# Patient Record
Sex: Male | Born: 1945 | Race: Black or African American | Hispanic: No | Marital: Married | Smoking: Current every day smoker
Health system: Southern US, Community
[De-identification: ages and names within clinical notes are randomized; demographics above are authoritative.]

## PROBLEM LIST (undated history)

## (undated) DIAGNOSIS — H9190 Unspecified hearing loss, unspecified ear: Secondary | ICD-10-CM

## (undated) DIAGNOSIS — D126 Benign neoplasm of colon, unspecified: Secondary | ICD-10-CM

## (undated) DIAGNOSIS — E7800 Pure hypercholesterolemia, unspecified: Secondary | ICD-10-CM

## (undated) DIAGNOSIS — K5730 Diverticulosis of large intestine without perforation or abscess without bleeding: Secondary | ICD-10-CM

## (undated) DIAGNOSIS — F419 Anxiety disorder, unspecified: Secondary | ICD-10-CM

## (undated) DIAGNOSIS — D179 Benign lipomatous neoplasm, unspecified: Secondary | ICD-10-CM

## (undated) DIAGNOSIS — T7840XA Allergy, unspecified, initial encounter: Secondary | ICD-10-CM

## (undated) DIAGNOSIS — Z720 Tobacco use: Secondary | ICD-10-CM

## (undated) DIAGNOSIS — S82409A Unspecified fracture of shaft of unspecified fibula, initial encounter for closed fracture: Secondary | ICD-10-CM

## (undated) DIAGNOSIS — I10 Essential (primary) hypertension: Secondary | ICD-10-CM

## (undated) HISTORY — PX: INGUINAL HERNIA REPAIR: SUR1180

## (undated) HISTORY — PX: POLYPECTOMY: SHX149

## (undated) HISTORY — DX: Unspecified fracture of shaft of unspecified fibula, initial encounter for closed fracture: S82.409A

## (undated) HISTORY — PX: ANKLE SURGERY: SHX546

## (undated) HISTORY — DX: Anxiety disorder, unspecified: F41.9

## (undated) HISTORY — DX: Pure hypercholesterolemia, unspecified: E78.00

## (undated) HISTORY — DX: Benign neoplasm of colon, unspecified: D12.6

## (undated) HISTORY — DX: Allergy, unspecified, initial encounter: T78.40XA

## (undated) HISTORY — DX: Unspecified hearing loss, unspecified ear: H91.90

## (undated) HISTORY — PX: NOSE SURGERY: SHX723

## (undated) HISTORY — DX: Tobacco use: Z72.0

## (undated) HISTORY — DX: Benign lipomatous neoplasm, unspecified: D17.9

## (undated) HISTORY — PX: COLONOSCOPY: SHX174

## (undated) HISTORY — DX: Diverticulosis of large intestine without perforation or abscess without bleeding: K57.30

---

## 2000-07-10 ENCOUNTER — Other Ambulatory Visit: Admission: RE | Admit: 2000-07-10 | Discharge: 2000-07-10 | Payer: Self-pay | Admitting: Gastroenterology

## 2000-07-10 ENCOUNTER — Encounter (INDEPENDENT_AMBULATORY_CARE_PROVIDER_SITE_OTHER): Payer: Self-pay

## 2002-10-22 ENCOUNTER — Encounter: Payer: Self-pay | Admitting: Emergency Medicine

## 2002-10-22 ENCOUNTER — Emergency Department (HOSPITAL_COMMUNITY): Admission: EM | Admit: 2002-10-22 | Discharge: 2002-10-22 | Payer: Self-pay | Admitting: *Deleted

## 2004-10-22 ENCOUNTER — Emergency Department (HOSPITAL_COMMUNITY): Admission: EM | Admit: 2004-10-22 | Discharge: 2004-10-22 | Payer: Self-pay | Admitting: Emergency Medicine

## 2004-10-26 ENCOUNTER — Ambulatory Visit: Payer: Self-pay | Admitting: Pulmonary Disease

## 2004-12-07 ENCOUNTER — Ambulatory Visit: Payer: Self-pay | Admitting: Pulmonary Disease

## 2004-12-20 ENCOUNTER — Ambulatory Visit (HOSPITAL_COMMUNITY): Admission: RE | Admit: 2004-12-20 | Discharge: 2004-12-20 | Payer: Self-pay | Admitting: Orthopedic Surgery

## 2005-04-13 ENCOUNTER — Ambulatory Visit: Payer: Self-pay | Admitting: Pulmonary Disease

## 2005-05-01 ENCOUNTER — Ambulatory Visit: Payer: Self-pay | Admitting: Pulmonary Disease

## 2005-09-14 ENCOUNTER — Ambulatory Visit: Payer: Self-pay | Admitting: Pulmonary Disease

## 2005-09-18 ENCOUNTER — Ambulatory Visit (HOSPITAL_COMMUNITY): Admission: RE | Admit: 2005-09-18 | Discharge: 2005-09-18 | Payer: Self-pay | Admitting: Pulmonary Disease

## 2006-04-02 ENCOUNTER — Ambulatory Visit: Payer: Self-pay | Admitting: Pulmonary Disease

## 2006-04-04 ENCOUNTER — Ambulatory Visit: Payer: Self-pay | Admitting: *Deleted

## 2006-09-16 IMAGING — US US ABDOMEN COMPLETE
1 series · 14 of 25 positions shown · non-contrast
Comparison: none

CLINICAL DATA: Abdominal pain particularly right upper quadrant.
 ABDOMEN ULTRASOUND:
TECHNIQUE: Complete abdominal ultrasound examination was performed including evaluation of the liver, gallbladder, bile ducts, pancreas, kidneys, spleen, IVC, and abdominal aorta.

[Series 1: unknown · 0.33mm/px · 14 of 114 slices shown]
[im 1/114]
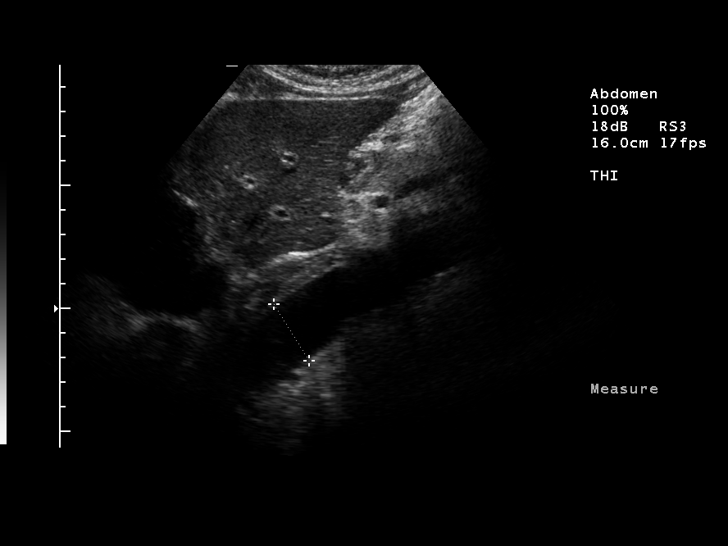
[im 10/114]
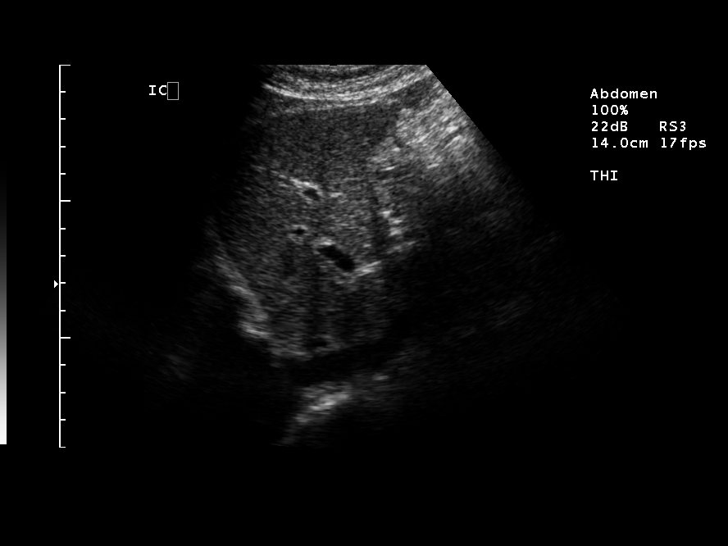
[im 19/114]
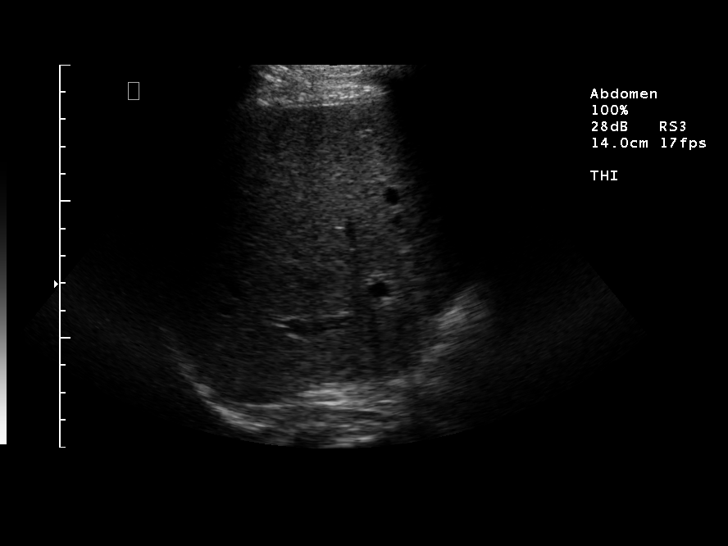
[im 29/114]
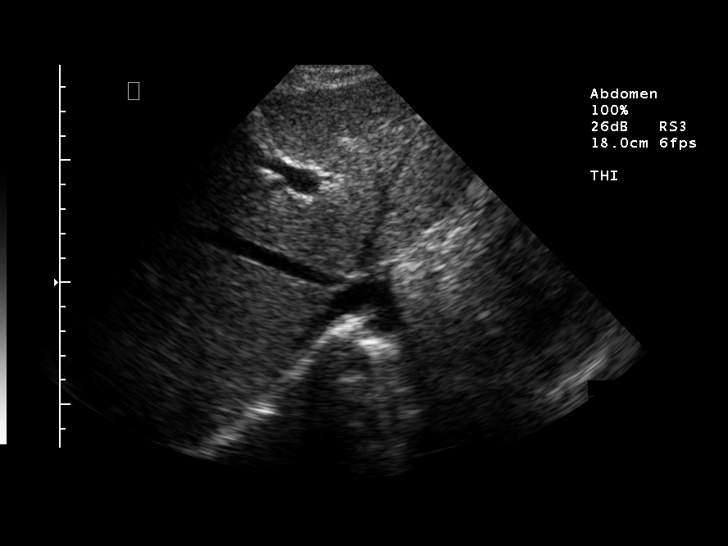
[im 38/114]
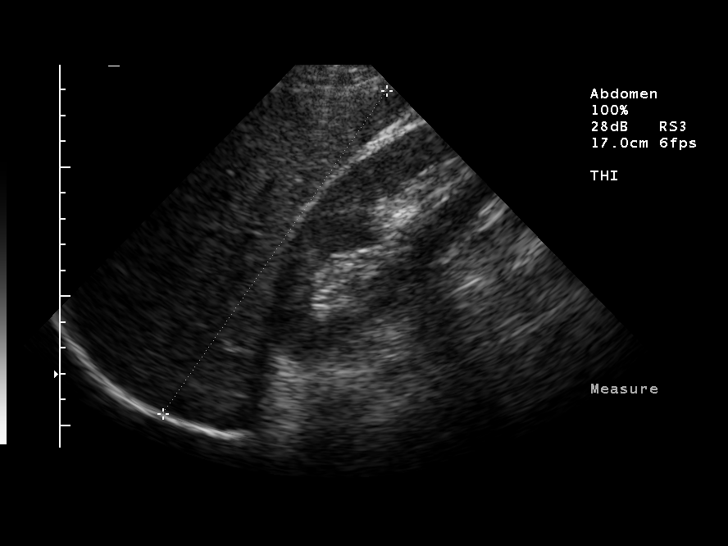
[im 43/114]
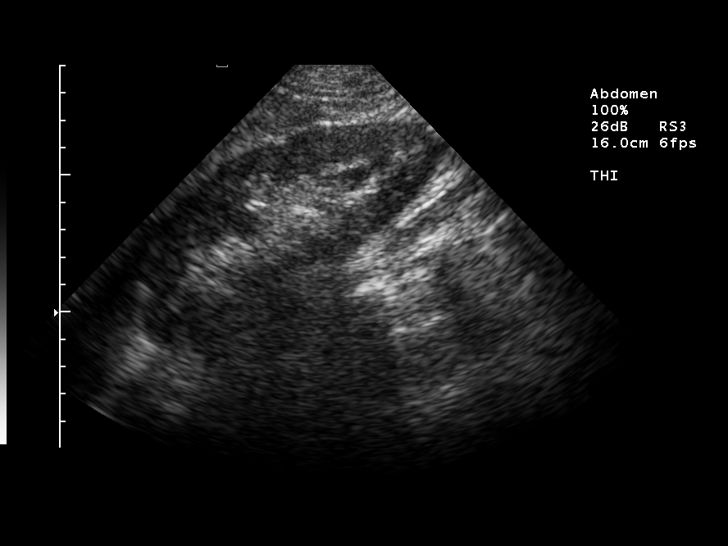
[im 52/114]
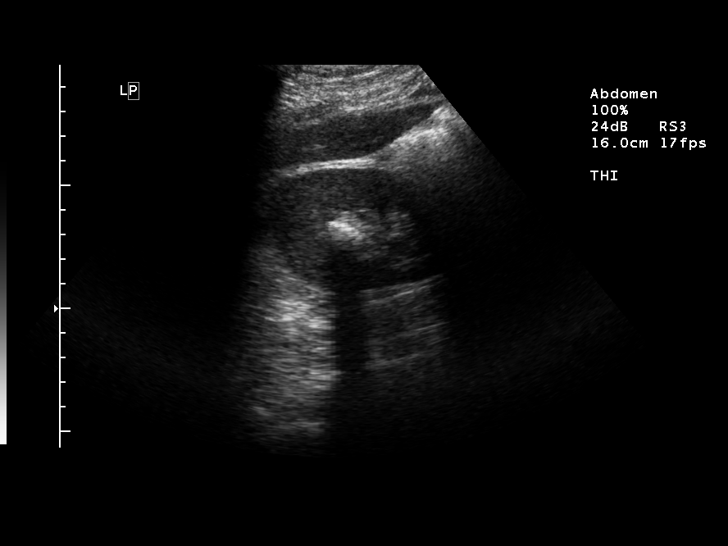
[im 62/114]
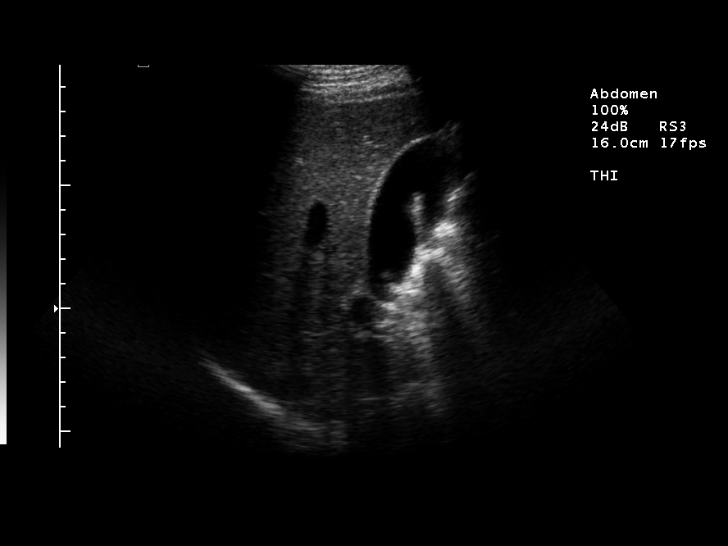
[im 71/114]
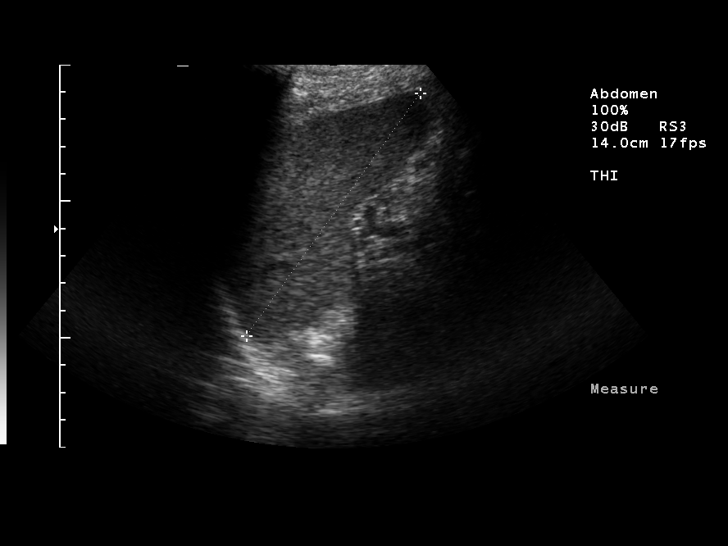
[im 76/114]
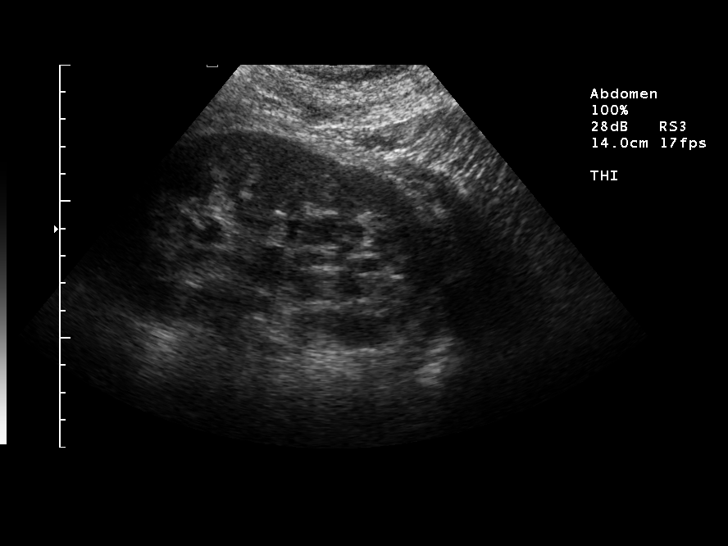
[im 85/114]
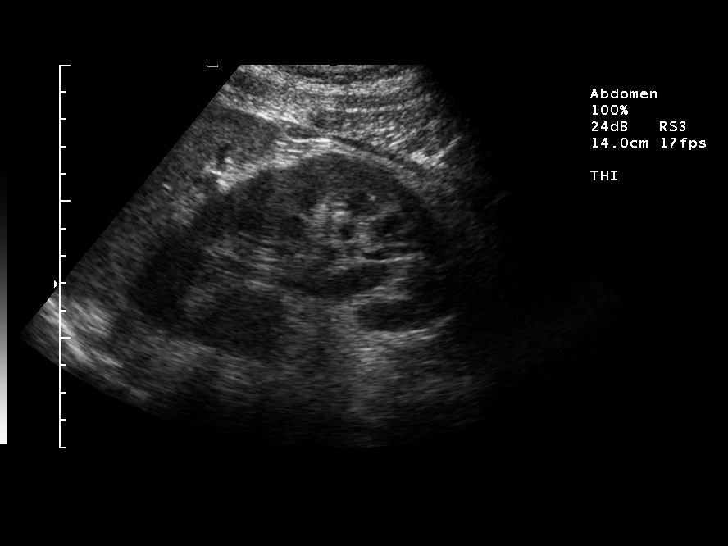
[im 95/114]
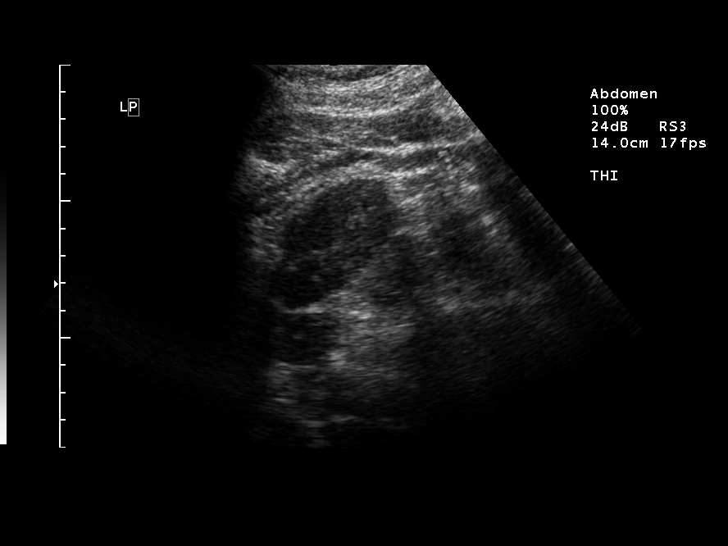
[im 104/114]
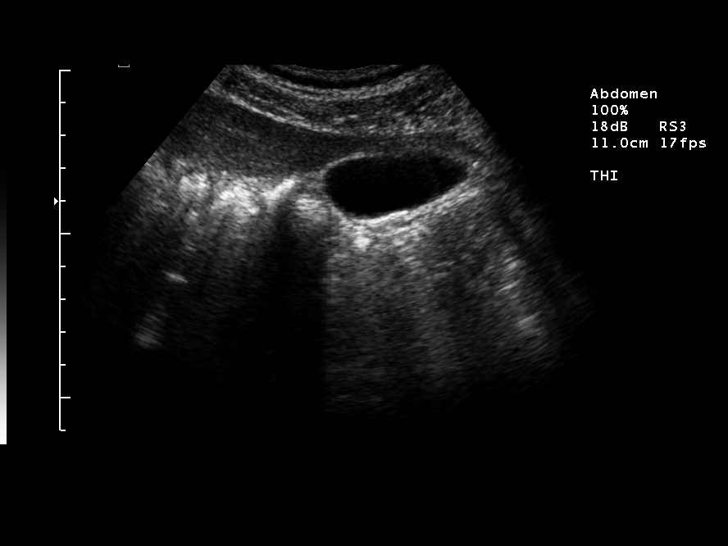
[im 114/114]
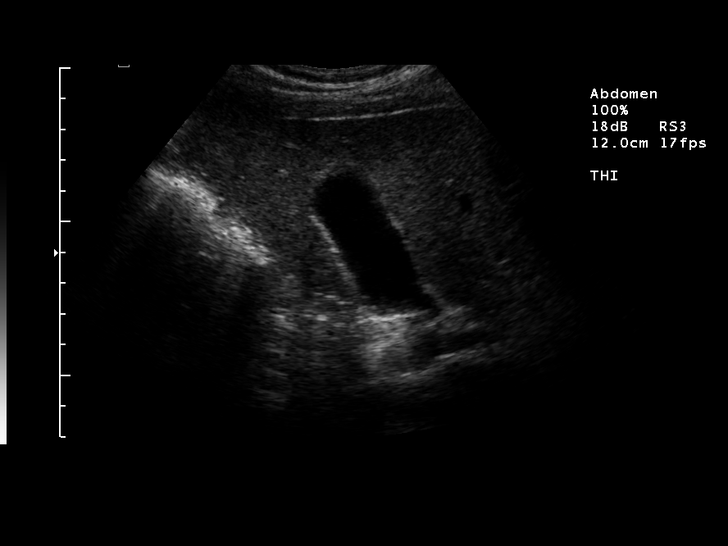

[14 of 25 positions shown; findings below may reference images not displayed]

FINDINGS: Scans over the upper abdomen were performed.  The gallbladder is well seen and no gallstones are noted.  The liver has a normal echogenic pattern.  No ductal dilatation is seen.  The common bile duct is normal measuring 5.3 mm.  The IVC, pancreas, and spleen appear normal.  No hydronephrosis is seen.  The right kidney measures 13.1 cm sagittally with the left kidney measuring 13.7 cm.  There does appear to be a calculus which is nonobstructing in the lower pole on the right measuring 1.3 cm.  Two hypoechoic structures are noted on the left consistent with small cysts of 1.5 and 2.8 cm maximum diameter respectively.  The abdominal aorta is within normal limits in size with no focal aneurysmal dilatation.
IMPRESSION: 1.  No gallstones.
 2.  No ductal dilatation.
 3.  Nonobstructing 1.3 cm lower pole right renal calculus.

## 2007-02-27 ENCOUNTER — Emergency Department (HOSPITAL_COMMUNITY): Admission: EM | Admit: 2007-02-27 | Discharge: 2007-02-27 | Payer: Self-pay | Admitting: Emergency Medicine

## 2007-02-27 ENCOUNTER — Encounter: Payer: Self-pay | Admitting: Orthopedic Surgery

## 2007-03-04 ENCOUNTER — Ambulatory Visit: Payer: Self-pay | Admitting: Orthopedic Surgery

## 2007-03-04 DIAGNOSIS — S82409A Unspecified fracture of shaft of unspecified fibula, initial encounter for closed fracture: Secondary | ICD-10-CM | POA: Insufficient documentation

## 2007-03-11 ENCOUNTER — Telehealth (INDEPENDENT_AMBULATORY_CARE_PROVIDER_SITE_OTHER): Payer: Self-pay | Admitting: *Deleted

## 2007-04-10 ENCOUNTER — Encounter: Payer: Self-pay | Admitting: Orthopedic Surgery

## 2007-05-17 DIAGNOSIS — E785 Hyperlipidemia, unspecified: Secondary | ICD-10-CM | POA: Insufficient documentation

## 2007-05-17 DIAGNOSIS — D126 Benign neoplasm of colon, unspecified: Secondary | ICD-10-CM | POA: Insufficient documentation

## 2007-05-17 DIAGNOSIS — J209 Acute bronchitis, unspecified: Secondary | ICD-10-CM | POA: Insufficient documentation

## 2007-05-17 DIAGNOSIS — E7800 Pure hypercholesterolemia, unspecified: Secondary | ICD-10-CM | POA: Insufficient documentation

## 2007-05-17 DIAGNOSIS — F411 Generalized anxiety disorder: Secondary | ICD-10-CM | POA: Insufficient documentation

## 2007-05-17 DIAGNOSIS — I10 Essential (primary) hypertension: Secondary | ICD-10-CM | POA: Insufficient documentation

## 2007-05-20 ENCOUNTER — Ambulatory Visit: Payer: Self-pay | Admitting: Pulmonary Disease

## 2007-05-20 DIAGNOSIS — K5730 Diverticulosis of large intestine without perforation or abscess without bleeding: Secondary | ICD-10-CM | POA: Insufficient documentation

## 2007-05-20 DIAGNOSIS — N200 Calculus of kidney: Secondary | ICD-10-CM | POA: Insufficient documentation

## 2007-05-20 DIAGNOSIS — D179 Benign lipomatous neoplasm, unspecified: Secondary | ICD-10-CM | POA: Insufficient documentation

## 2007-05-20 HISTORY — DX: Diverticulosis of large intestine without perforation or abscess without bleeding: K57.30

## 2007-06-02 DIAGNOSIS — M5136 Other intervertebral disc degeneration, lumbar region: Secondary | ICD-10-CM | POA: Insufficient documentation

## 2008-05-20 ENCOUNTER — Encounter (INDEPENDENT_AMBULATORY_CARE_PROVIDER_SITE_OTHER): Payer: Self-pay | Admitting: *Deleted

## 2008-06-23 ENCOUNTER — Ambulatory Visit: Payer: Self-pay | Admitting: Gastroenterology

## 2008-06-23 ENCOUNTER — Telehealth (INDEPENDENT_AMBULATORY_CARE_PROVIDER_SITE_OTHER): Payer: Self-pay | Admitting: *Deleted

## 2008-07-07 ENCOUNTER — Encounter: Payer: Self-pay | Admitting: Gastroenterology

## 2008-07-07 ENCOUNTER — Ambulatory Visit: Payer: Self-pay | Admitting: Gastroenterology

## 2008-07-08 ENCOUNTER — Encounter: Payer: Self-pay | Admitting: Gastroenterology

## 2008-07-09 ENCOUNTER — Ambulatory Visit: Payer: Self-pay | Admitting: Pulmonary Disease

## 2008-07-09 DIAGNOSIS — R300 Dysuria: Secondary | ICD-10-CM | POA: Insufficient documentation

## 2008-07-13 LAB — CONVERTED CEMR LAB
Ketones, ur: NEGATIVE mg/dL
Leukocytes, UA: NEGATIVE
Nitrite: NEGATIVE
Specific Gravity, Urine: 1.025 (ref 1.000–1.030)
Urobilinogen, UA: 0.2 (ref 0.0–1.0)

## 2008-11-09 ENCOUNTER — Telehealth (INDEPENDENT_AMBULATORY_CARE_PROVIDER_SITE_OTHER): Payer: Self-pay | Admitting: *Deleted

## 2008-11-12 ENCOUNTER — Ambulatory Visit: Payer: Self-pay | Admitting: Pulmonary Disease

## 2008-11-16 ENCOUNTER — Ambulatory Visit: Payer: Self-pay | Admitting: Adult Health

## 2008-11-17 LAB — CONVERTED CEMR LAB
ALT: 16 units/L (ref 0–53)
Alkaline Phosphatase: 59 units/L (ref 39–117)
BUN: 9 mg/dL (ref 6–23)
Basophils Absolute: 0 10*3/uL (ref 0.0–0.1)
Bilirubin, Direct: 0.2 mg/dL (ref 0.0–0.3)
Cholesterol: 133 mg/dL (ref 0–200)
Eosinophils Absolute: 0.3 10*3/uL (ref 0.0–0.7)
GFR calc non Af Amer: 87.52 mL/min (ref >60)
Lymphocytes Relative: 29.9 % (ref 12.0–46.0)
MCHC: 34.5 g/dL (ref 30.0–36.0)
Neutrophils Relative %: 56.7 % (ref 43.0–77.0)
PSA: 0.59 ng/mL (ref 0.10–4.00)
Potassium: 4.3 meq/L (ref 3.5–5.1)
RBC: 4.28 M/uL (ref 4.22–5.81)
RDW: 11.4 % — ABNORMAL LOW (ref 11.5–14.6)
TSH: 1.63 microintl units/mL (ref 0.35–5.50)
Total Protein: 7 g/dL (ref 6.0–8.3)
Triglycerides: 49 mg/dL (ref 0.0–149.0)
VLDL: 9.8 mg/dL (ref 0.0–40.0)

## 2009-02-24 ENCOUNTER — Ambulatory Visit: Payer: Self-pay | Admitting: Pulmonary Disease

## 2009-02-24 DIAGNOSIS — Z720 Tobacco use: Secondary | ICD-10-CM | POA: Insufficient documentation

## 2009-02-24 DIAGNOSIS — F17200 Nicotine dependence, unspecified, uncomplicated: Secondary | ICD-10-CM

## 2009-07-09 ENCOUNTER — Telehealth (INDEPENDENT_AMBULATORY_CARE_PROVIDER_SITE_OTHER): Payer: Self-pay | Admitting: *Deleted

## 2009-07-09 ENCOUNTER — Encounter: Payer: Self-pay | Admitting: Pulmonary Disease

## 2009-08-02 ENCOUNTER — Telehealth (INDEPENDENT_AMBULATORY_CARE_PROVIDER_SITE_OTHER): Payer: Self-pay | Admitting: *Deleted

## 2009-08-10 ENCOUNTER — Ambulatory Visit: Payer: Self-pay | Admitting: Pulmonary Disease

## 2010-02-21 ENCOUNTER — Ambulatory Visit: Payer: Self-pay | Admitting: Pulmonary Disease

## 2010-02-28 ENCOUNTER — Ambulatory Visit: Payer: Self-pay | Admitting: Pulmonary Disease

## 2010-02-28 LAB — CONVERTED CEMR LAB
ALT: 23 units/L (ref 0–53)
AST: 20 units/L (ref 0–37)
Albumin: 3.9 g/dL (ref 3.5–5.2)
BUN: 16 mg/dL (ref 6–23)
Basophils Absolute: 0 10*3/uL (ref 0.0–0.1)
Chloride: 105 meq/L (ref 96–112)
Cholesterol: 156 mg/dL (ref 0–200)
Eosinophils Relative: 3.7 % (ref 0.0–5.0)
Glucose, Bld: 107 mg/dL — ABNORMAL HIGH (ref 70–99)
MCV: 95.1 fL (ref 78.0–100.0)
Monocytes Absolute: 0.4 10*3/uL (ref 0.1–1.0)
Neutrophils Relative %: 66.9 % (ref 43.0–77.0)
PSA: 0.5 ng/mL (ref 0.10–4.00)
Platelets: 201 10*3/uL (ref 150.0–400.0)
Potassium: 4.3 meq/L (ref 3.5–5.1)
TSH: 1.38 microintl units/mL (ref 0.35–5.50)
Total Bilirubin: 0.8 mg/dL (ref 0.3–1.2)
WBC: 6 10*3/uL (ref 4.5–10.5)

## 2010-05-01 LAB — CONVERTED CEMR LAB
Albumin: 3.9 g/dL (ref 3.5–5.2)
Alkaline Phosphatase: 66 units/L (ref 39–117)
BUN: 8 mg/dL (ref 6–23)
Basophils Absolute: 0 10*3/uL (ref 0.0–0.1)
Cholesterol: 167 mg/dL (ref 0–200)
Eosinophils Absolute: 0.2 10*3/uL (ref 0.0–0.6)
GFR calc Af Amer: 98 mL/min
GFR calc non Af Amer: 81 mL/min
HCT: 42.2 % (ref 39.0–52.0)
HDL: 48.6 mg/dL (ref >39.0)
Hemoglobin: 13.9 g/dL (ref 13.0–17.0)
MCHC: 33 g/dL (ref 30.0–36.0)
MCV: 91.5 fL (ref 78.0–100.0)
Monocytes Absolute: 0.5 10*3/uL (ref 0.2–0.7)
Monocytes Relative: 8.5 % (ref 3.0–11.0)
Neutro Abs: 2.9 10*3/uL (ref 1.4–7.7)
Neutrophils Relative %: 53.6 % (ref 43.0–77.0)
PSA: 0.52 ng/mL (ref 0.10–4.00)
Potassium: 4.4 meq/L (ref 3.5–5.1)
Sodium: 143 meq/L (ref 135–145)
TSH: 0.97 microintl units/mL (ref 0.35–5.50)
Total Bilirubin: 0.8 mg/dL (ref 0.3–1.2)

## 2010-05-03 NOTE — Letter (Signed)
Summary: [facility]  [address]   [place], [place] [address]   Phone: [phone]  Fax: [phone]    07/09/2009     To Whom It May Concern,   [name] is a 66 year old patient of mine followed for general medical purposes.   He is requesting work leave from 07/12/2009 through 08/02/2009 due to his nerves and insomnia.   We have him on medication for these problems.  Thank you for your consideration in this matter.         Sincerely,       Lonzo Cloud. Elayne Snare. D.  Appended Document: Generic Letter this letter has been placed in recycle bin due to the pts name--should be [name] and not jerry---has been corrected and the correct letter has been left up front for pt to come by and pick up.

## 2010-05-03 NOTE — Assessment & Plan Note (Signed)
Summary: 6 months/ mbw   Primary Care Provider:  Alroy Dust  CC:  6 month ROV & reivew of mult medical problems....  History of Present Illness: 65 y/o BM here for a follow up visit...  he has multiple medical problems as noted below...   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 it has affected his BP & caused systemic symptoms w/ sweating, panic, etc... all symptoms improved w/ LOA & he is contemplating not going back to this position which I support>> note written for time off for medical reasons.   ~  February 21, 2010:  6 month ROV- he reports doing well w/o new complaints or concerns... BP is still elev however & he hasn't been checking at home so we decided to incr the Norvasc to 10mg /d... he denies CP, palpit, SOB, cough, etc... stil smoking 1/2-1ppd & we discussed smoking cessation but he is not motivated to quit, offered Chantix, mentioned nicotine replacement Rx etc... he is due for yearly CXR & he will ret for fasting blood work... OK 2011 Flu vaccine today.    Current Problems:   ASTHMATIC BRONCHITIS, ACUTE (ICD-466.0) - he had quit smoking in 2004 & restarted in the last yr  ~1/2 ppd now... baseline CXR w/ mild chr interstitial changes, NAD... he denies cough, sputum, hemoptysis, worsening dyspnea,  wheezing, chest pains, snoring, daytime hypersomnolence, etc...   ~  CXR 11/10 few chr changes, no infiltrates etc, NAD.Marland Kitchen.  ~  CXR 11/11 showed clear lungs, NAD...  HYPERTENSION (ICD-401.9) - prev controlled on ASA 81mg /d & NORVASC 5mg /d... BP now =160/86 and denies HA, fatigue, visual changes, CP, palipit, dizziness, syncope, dyspnea, edema, etc... we discussed the need to monitor BP carefully at home, keep log of reading, & call if BP consistantly >150/90...   ~  11/11:  BP still elev & we decided to incr NORVASC  10mg /d...  HYPERCHOLESTEROLEMIA (ICD-272.0) - on SIMVASTATIN 20mg /d...  ~  FLP in 2007 on diet alone showed TChol 203, TG 72, HDL 44, LDL 149... start Simva20.  ~  FLP 2/09 on Simva20 showed TChol 167, TG 81, HDL 49, LDL 102... keep same.  ~  FLP 8/10 on Simva20 showed Tchol 133, TG 49, HDL 56, LDL 67... GREAT!  ~  FLP 11/11 on Simva20 showed TChol 156, TG 55, HDL 64, LDL 82  DIVERTICULOSIS OF COLON (ICD-562.10) & COLONIC POLYPS (ICD-211.3) - colonoscopy in 2002 showed several adenomatous polyps... repeat colon 4/05 was neg x divertics... f/u planned 73yrs.  ~  f/u colonoscopy 4/10 showed divertics & several sm polyps- all hyperplastic... f/u planned 59yrs.  Hx of NEPHROLITHIASIS (ICD-592.0) - Sonar & CTAbd 1/08 showed right calyceal calculi (1.3cm stone)... also c/oED on Viagra Prn.  DEGENERATIVE DISC DISEASE (ICD-722.6) - CTAbd 1/08 also revealed lumbar degen changes- esp L4-5 & L5-S1 w/ migrated disc frag in lat recess- he denies LBP etc...  CLOSED FRACTURE OF UNSPECIFIED PART OF FIBULA (ICD-823.81) - he fell from a roof & fractured his Lft fibular head... he was seen by DrHarrison in [place], no surg- just conservative Rx & improved.  ANXIETY (ICD-300.00) - uses Alprazolam as needed.  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 Father died age 41 from cancer in his neck, no hx heart problems. Mother alive age 79 w/ HBP, poor memory, no hx of heart disease. 7Sibs:  4Bro- no health issues;  3Sis- one w/ HBP  Social History: Reviewed history from 08/10/2009 and no changes required. Patient is married.  current smoker - 1ppd, x65yrs rare alcohol  3 children  retired from [employer].  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    Vital Signs:  Patient profile:   65 year old male Height:      73 inches Weight:      180.13 pounds BMI:     23.85 O2 Sat:      92 % on Room air Temp:     97.4 degrees F oral Pulse rate:   73 / minute BP sitting:   160 / 86  (left arm) Cuff size:   regular  Vitals Entered By: Randell Loop CMA (February 21, 2010 9:45 AM)  O2 Sat at Rest %:  92 O2 Flow:  Room air CC: 6 month ROV & reivew of mult medical problems... Is Patient Diabetic? No Pain Assessment Patient in pain? no      Comments no changes in meds per pt   Physical Exam  Additional Exam:  WD, WN, 64 y/o BM in NAD... GENERAL:  Alert & oriented; pleasant & cooperative. HEENT:  [place]/AT, EOM-wnl, PERRLA, EACs-clear, TMs-wnl, NOSE-clear, THROAT-clear &  wnl. NECK:  Supple w/ full ROM; no JVD; normal carotid impulses w/o bruits; no thyromegaly or nodules palpated; no lymphadenopathy. CHEST:  Clear to P & A; without wheezes/ rales/ or rhonchi. HEART:  Regular Rhythm; without murmurs/ rubs/ or gallops. ABDOMEN:  Soft & nontender; normal bowel sounds; no organomegaly or masses detected. EXT: without deformities or arthritic changes; no varicose veins/ venous insuffic/ or edema. NEURO:  CN's intact; motor testing normal; sensory testing normal; gait normal & balance OK. DERM:  No lesions noted; no rash etc...    MISC. Report  Procedure date:  02/21/2010  Findings:      DATA REVIEWED:  ~  CXR 02/21/10, and Fasting Labs 02/28/10...   Impression & Recommendations:  Problem # 1:  ASTHMATIC BRONCHITIS, ACUTE (ICD-466.0) Still smoking &he knows the importance of smoking cessation... Orders: T-2 View CXR ([id number])  Problem # 2:  HYPERTENSION (ICD-401.9) BP remains elev & he hasn't been checking at home... asked to start checking BP at home... rec to incr the NORVASC to 10mg /d... His updated medication list for this problem includes:    Amlodipine Besylate 10 Mg  Tabs (Amlodipine besylate) .Marland Kitchen... Take 1 tab by mouth once daily...  Problem # 3:  HYPERCHOLESTEROLEMIA (ICD-272.0) Stable on the diet + Simva20... His updated medication list for this problem includes:    Simvastatin 20 Mg Tabs (Simvastatin) .Marland Kitchen... 1 tab by mouth at bedtime as directed for cholesterol...  Problem # 4:  DIVERTICULOSIS OF COLON (ICD-562.10) GI stable & up to date...  Problem # 5:  Hx of NEPHROLITHIASIS (ICD-592.0) GU stable...  Problem # 6:  DEGENERATIVE DISC DISEASE (ICD-722.6) Aware- doing satis & only req OTC meds...  Problem # 7:  OTHER MEDICAL PROBLEMS AS NOTED>>> OK Flu shot today.  Complete Medication List: 1)  Adult Aspirin Ec Low Strength 81 Mg Tbec (Aspirin) .... Take 1 tablet by mouth once a day holding 2)  Amlodipine Besylate 10 Mg Tabs  (Amlodipine besylate) .... Take 1 tab by mouth once daily.Marland KitchenMarland Kitchen 3)  Simvastatin 20 Mg Tabs (Simvastatin) .Marland Kitchen.. 1 tab by mouth at bedtime as directed for cholesterol... 4)  Viagra 100 Mg Tabs (Sildenafil citrate) .... Take one tablet by mouth as needed 5)  Alprazolam 0.5 Mg Tabs (Alprazolam) .... Take one half to one tablet three x a day as needed for nerves 6)  Multivitamins Tabs (Multiple vitamin) .... Take 1 tablet by mouth once a day  Other Orders: Influenza Vaccine NON MCR ([id number])  Patient Instructions: 1)  Today we updated your med list- see below.... 2)  We decided to increase the NORVASC (Amlodipine) to 10mg  daily & watch your BP at home... call for any questions... 3)  Today we did your follow up CXR.Marland KitchenMarland Kitchen 4)  We also gave you the 2011 Flu vaccine... 5)  Please return top our lab one morning soon - FASTING- for your annual blood work... .then please call the "phone tree" in a few days for your lab results.Marland KitchenMarland Kitchen  6)  Please schedule a follow-up appointment in 6 months. Prescriptions: AMLODIPINE BESYLATE 10 MG TABS (AMLODIPINE BESYLATE) take 1 tab by mouth once daily...  #30 x 12   Entered and Authorized by:   Michele Mcalpine MD   Signed by:   Michele Mcalpine MD on 02/21/2010   Method used:   Print then Give to Patient   RxID:   [id number]    Immunizations Administered:  Influenza Vaccine # 1:    Vaccine Type: Fluvax Non-MCR    Site: left deltoid    Mfr: GlaxoSmithKline    Dose: 0.5 ml    Route: IM    Given by: Randell Loop CMA    Exp. Date: 10/01/2010    Lot #: [id number]    VIS given: 10/26/09 version given February 21, 2010.  Flu Vaccine Consent Questions:    Do you have a history of severe allergic reactions to this vaccine? no    Any prior history of allergic reactions to egg and/or gelatin? no    Do you have a sensitivity to the preservative Thimersol? no    Do you have a past history of Guillan-Barre Syndrome? no    Do you currently have an acute febrile illness? no     Have you ever had a severe reaction to latex? no    Vaccine information given and explained to patient? yes

## 2010-05-03 NOTE — Progress Notes (Signed)
Summary: leave from work  Phone Note Call from Patient   Caller: Patient Call For: nadel Summary of Call: pt would like extended leave from work Initial call taken by: Rickard Patience,  Aug 02, 2009 10:43 AM  Follow-up for Phone Call        [facility]. [name] CMA  Aug 02, 2009 10:48 AM  Spoke with pt.  Pt states he went back to work today and now requesting an extention x3 more wks.  WIll forward to SN - pls advise if you are ok with this.  Thanks! Follow-up by: Gweneth Dimitri RN,  Aug 02, 2009 11:46 AM  Additional Follow-up for Phone Call Additional follow up Details #1::        per SN---cant give any more time off for this medical diagnosis----any further time off will require eval by specialty consult.  thanks Randell Loop CMA  Aug 02, 2009 4:36 PM     Additional Follow-up for Phone Call Additional follow up Details #2::    LMOMTCB Vernie Murders  Aug 02, 2009 4:39 PM  Returning call Darletta Moll  Aug 03, 2009 8:22 AM   called spoke with patient.  advised of recs advised by SN.  pt verbalized his understanding, but requested to see Dr. Kriste Basque in the office first.  appt made with SN 08-10-09 @ 1500.  pt okay with this date and time. Boone Master CNA  Aug 03, 2009 10:05 AM

## 2010-05-03 NOTE — Progress Notes (Signed)
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 date beginning mon 07/12/09 through 08/02/09. pt wants to pick this up when ready.  Initial call taken by: Tivis Ringer, CNA,  July 09, 2009 9:44 AM  Follow-up for Phone Call        called, spoke with pt.  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  Will forward to SN - pls advise of your recs! Thanks! Follow-up by: Gweneth Dimitri RN,  July 09, 2009 9:51 AM  Additional Follow-up for Phone Call Additional follow up Details #1::        lmom for pt to make him aware of letter that is ready to be picked up. Randell Loop CMA  July 09, 2009 4:05 PM     Additional Follow-up for Phone Call Additional follow up Details #2::    pt did come by and pick this letter up. Randell Loop CMA  July 12, 2009 3:24 PM

## 2010-05-03 NOTE — Letter (Signed)
Summary: [facility]  [address]   [place], [place] [address]   Phone: [phone]  Fax: [phone]    07/09/2009     To Whom It May Concern,   [name] is a 65 year old patient of mine followed for general medical purposes.   He is requesting work leave from 07/12/2009 through 08/02/2009 due to his nerves and insomnia.   We have him on medication for these problems.  Thank you for your consideration in this matter.        Sincerely,      Lonzo Cloud. Kriste Basque,  M.D.

## 2010-05-03 NOTE — Assessment & Plan Note (Signed)
Summary: ov to discuss 08-02-09 phone msg///JJ   Primary Care Provider:  Alroy Dust  CC:  6 month ROV & review of mult medical problems....  History of Present Illness: 65 y/o BM here for a follow up visit...  he has multiple medical problems as noted below...     ~  Feb09:  his only complaints are of an orthopedic nature- he fell in Nov and fractured his Lft fibular head... he was seen by DrHarrison in [place], no surg- just conservative Rx... improved now... also notes some Right shoulder discomfort... no other complaints or concerns... he would like to get his meds refilled for 2009...   ~  February 24, 2009:  doing well overall- his CC is some night sweats> no f/c, no infection symptoms of cough, phlegm, URI, etc... he gets plenty of exercise- yard, cutting wood for wood stove, etc... we checked CXR- clear, NAD... he will adjust heat, covers, etc...   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 it has affected his BP & caused systemic symptoms w/ sweating, panic, etc... all symptoms improved w/ LOA & he is contemplating not going back to this position which I support>> note written for time off for medical reasons.    Current Problems:   ASTHMATIC BRONCHITIS, ACUTE (ICD-466.0) - he had quit smoking in 2004 & restarted in the last yr  ~1ppd now... baseline CXR w/ mild chr interstitial changes, NAD... he denies cough, sputum, hemoptysis, worsening dyspnea,  wheezing, chest pains, snoring, daytime hypersomnolence, etc...   ~  CXR 11/10 few chr changes, no infiltrates etc, NAD...  HYPERTENSION (ICD-401.9) - prev controlled on ASA 81mg /d & NORVASC 5mg /d... BP now =160/90 and denies HA, fatigue, visual changes, CP, palipit, dizziness, syncope, dyspnea, edema, etc... we discussed the need to monitor BP carefully at home, keep log of reading, & call if BP  consistantly >150/90...   HYPERCHOLESTEROLEMIA (ICD-272.0) - on SIMVASTATIN 20mg /d...  ~  FLP in 2007 on diet alone showed TChol 203, TG 72, HDL 44, LDL 149... start Simva20.  ~  FLP 2/09 on Simva20 showed TChol 167, TG 81, HDL 49, LDL 102... keep same.  ~  FLP 8/10 on Simva20 showed Tchol 133, TG 49, HDL 56, LDL 67... GREAT!  DIVERTICULOSIS OF COLON (ICD-562.10) & COLONIC POLYPS (ICD-211.3) - colonoscopy in 2002 showed several adenomatous polyps... repeat colon 4/05 was neg x divertics... f/u planned 43yrs.  ~  f/u colonoscopy 4/10 showed divertics & several sm polyps- all hyperplastic... f/u planned 68yrs.  Hx of NEPHROLITHIASIS (ICD-592.0) - Sonar & CTAbd 1/08 showed right calyceal calculi (1.3cm stone)  DEGENERATIVE DISC DISEASE (ICD-722.6) - CTAbd 1/08 also revealed lumbar degen changes- esp L4-5 & L5-S1 w/ migrated disc frag in lat recess.  CLOSED FRACTURE OF UNSPECIFIED PART OF FIBULA (ICD-823.81) -see above... he fell from a roof!  ANXIETY (ICD-300.00) - uses Alprazolam as needed.  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 Father died age 37 from cancer in his neck, no hx heart problems. Mother alive age 80 w/ HBP, poor memory, no hx of heart disease. 7Sibs:  4Bro- no health issues;  3Sis- one w/ HBP  Social History: Reviewed history from 11/12/2008 and no changes  required. Patient is married.  current smoker - 1ppd, x75yrs rare alcohol  3 children  retired from [employer].  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   Review of Systems      See HPI  The patient denies anorexia, fever, weight loss, weight gain, vision loss, decreased hearing, hoarseness, chest pain, syncope, dyspnea on exertion, peripheral edema, prolonged cough, headaches, hemoptysis, abdominal pain, melena, hematochezia, severe indigestion/heartburn, hematuria, incontinence, muscle weakness, suspicious skin lesions, transient blindness, difficulty walking, depression, unusual weight change, abnormal bleeding, enlarged lymph nodes, and angioedema.    Vital Signs:  Patient profile:   65 year old male Height:      73 inches Weight:      183 pounds O2 Sat:      100 % on Room air Temp:     98.0 degrees F oral Pulse rate:   92 / minute BP sitting:   160 / 90  (right arm) Cuff size:   regular  Vitals Entered By: Randell Loop CMA (Aug 10, 2009 3:01 PM)  O2 Sat at Rest %:  100 O2 Flow:  Room air CC: 6 month ROV & review of mult medical problems... Is Patient Diabetic? No Pain Assessment Patient in pain? no      Comments no changes in meds today   Physical Exam  Additional Exam:  WD, WN, 65 y/o BM in NAD... GENERAL:  Alert & oriented; pleasant & cooperative. HEENT:  [place]/AT, EOM-wnl, PERRLA, EACs-clear, TMs-wnl, NOSE-clear, THROAT-clear & wnl. NECK:  Supple w/ full ROM; no JVD; normal carotid impulses w/o bruits; no thyromegaly or nodules palpated; no lymphadenopathy. CHEST:  Clear to P & A; without wheezes/ rales/ or rhonchi. HEART:  Regular Rhythm; without murmurs/ rubs/ or gallops. ABDOMEN:  Soft & nontender; normal bowel sounds; no organomegaly or masses detected. EXT: without deformities or arthritic changes; no varicose veins/ venous insuffic/ or edema. NEURO:  CN's intact; motor testing normal; sensory testing normal; gait normal & balance OK. DERM:  No  lesions noted; no rash etc...    Impression & Recommendations:  Problem # 1:  HYPERTENSION (ICD-401.9) We discussed the need for home monitoring and increase meds if BP consistantly >150/90...  Goal BP < 140/80's & he understands... His updated medication list for this problem includes:    Norvasc 5 Mg Tabs (Amlodipine besylate) .Marland Kitchen... Take 1 tablet by mouth once a day  Problem # 2:  CIGARETTE SMOKER (ICD-305.1) Discussed smoking cessation strategies and it should be easier w/ decr stress...  Problem # 3:  HYPERCHOLESTEROLEMIA (ICD-272.0) Stable on med>  continue same... His updated medication list for this problem includes:    Simvastatin 20 Mg Tabs (Simvastatin) .Marland Kitchen... 1 tab by mouth at bedtime as directed for cholesterol...  Problem # 4:  ANXIETY (ICD-300.00) As discussed>  note for work, use the [employer]... His updated medication list for this problem includes:    Alprazolam 0.5 Mg Tabs (Alprazolam) .Marland Kitchen... Take one half to one tablet three x a day as needed for nerves  Complete Medication List: 1)  Adult Aspirin Ec Low Strength 81 Mg Tbec (Aspirin) .... Take 1 tablet by mouth once a day holding 2)  Norvasc 5  Mg Tabs (Amlodipine besylate) .... Take 1 tablet by mouth once a day 3)  Simvastatin 20 Mg Tabs (Simvastatin) .Marland Kitchen.. 1 tab by mouth at bedtime as directed for cholesterol... 4)  Viagra 100 Mg Tabs (Sildenafil citrate) .... Take one tablet by mouth as needed 5)  Alprazolam 0.5 Mg Tabs (Alprazolam) .... Take one half to one tablet three x a day as needed for nerves 6)  Multivitamins Tabs (Multiple vitamin) .... Take 1 tablet by mouth once a day  Patient Instructions: 1)  Today we updated your med list- see below.... 2)  For your BP:  it is imperative that you take the Norvasc (Amlodipine) daily... you also need to elim salt (sodium) from your diet.Marland KitchenMarland Kitchen 3)  Please monitor your BP at home> check it 2-3 times per week or more & write it down in a log... if your BP is consistantly  >150/90 then you need to check back in w/ me so we can increase your BP meds for better control... if your BP is regularly <150/90 (ie- 140/ 80's or better) then you are OK... 4)  Call for any questions.Marland KitchenMarland Kitchen 5)  We wrote a letter for time off work from 5/2 - 08/20/09 as we discussed... 6)  Keep your regular follow up appt w/ CXR & fasting blood work for this fall.Marland KitchenMarland Kitchen

## 2010-05-03 NOTE — Letter (Signed)
Summary: [facility]  [address]   [place], [place] [address]   Phone: [phone]  Fax: [phone]    08/10/2009      To Whom It May Concern ,     [name] is a 65 year old patient of mine followed for general medical purposes.   He is requesting additional leave from work from 08-02-2009 until 08-20-2009.   We have him on medication for these problems.  Thank you for your consideration in this matter.        Sincerely,      Lonzo Cloud. Elayne Snare. D.

## 2010-06-20 ENCOUNTER — Telehealth: Payer: Self-pay | Admitting: Pulmonary Disease

## 2010-06-30 NOTE — Progress Notes (Signed)
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 pt will pick up when called. needs back asap.  Initial call taken by: Tivis Ringer, CNA,  June 20, 2010 2:10 PM  Follow-up for Phone Call        called and spoke with pt and he is aware papers are ready to be picked up Robert Nixon CMA  June 21, 2010 8:44 AM

## 2010-08-19 NOTE — Consult Note (Signed)
NAMEKEIFER, HABIB                       ACCOUNT NO.:  [id number]   MEDICAL RECORD NO.:  [id number]                   PATIENT TYPE:  EMS   LOCATION:  [facility]                                 FACILITY:  [facility]   PHYSICIAN:  Karol T. Lazarus Salines, M.D.              DATE OF BIRTH:  1946/01/30   DATE OF CONSULTATION:  10/22/2002  DATE OF DISCHARGE:                                   CONSULTATION   CHIEF COMPLAINT:  Chin swelling.   HISTORY OF PRESENT ILLNESS:  This 65 year old black male has had a small  pimple-type lesion under his chin maybe the better part of a year.  He will  occasionally pick or squeeze at it, producing some whitish, stinky material.  Two or three weeks ago, the area began to become swollen and somewhat  tender.  He saw Dr. Kriste Basque, his primary internist, who placed him on Keflex  twice a day.  He admits that he has only taken maybe one a day and not even  every day off and on for three weeks.  For the past two or three days, the  swelling has gotten progressively worse.  He is feeling like it is starting  to press a little bit against his throat and he came to the emergency room  for attention to this process.  He denies fevers.  He is not really having  much in the way of pain.  No actual breathing or swallowing difficulty or  change in voice.  No trouble moving his jaw, moving his tongue, or  articulating his words.  He has never had a prior similar problem.  He is  not diabetic or otherwise immune compromised.  In the emergency room, the  CBC showed a white count of 8800.  The CT scan showed an incidental left  posterior mandibular periapical abscess and a diffuse cellulitis with some  reactive adenopathy in the submental area.   PHYSICAL EXAMINATION:  GENERAL APPEARANCE:  This is a trim, healthy-  appearing, middle-aged, black male in no acute distress.  Voice is loud and  clear.  Articulation is also clear.  No significant fetor oris.  He is not  warm to touch.   Mental status is intact.  HEENT:  The ears are clear with aerated drums and normal configuration.  Anterior nose clear.  The oral cavity is moist.  The tongue is not  restricted, elevated, or swollen.  The floor of the mouth is soft.  The  tongue is soft.  The base of the tongue is soft.  The palate is normal.  I  did not examine the nasopharynx or hypopharynx.  NECK:  Remarkable for a 5 cm, roughly hemispherical swelling of the  submental area with a core in the center consistent with a sebaceous cyst.  There is no discharge, either purulent or sebaceous material.  There is no  fluctuance or subcutaneous crepitus.  There is no  neck adenopathy.   IMPRESSION:  1. Infected submental sebaceous cyst incompletely treated.  2. Incidental left mandibular molar periapical abscess.  No internal oral     involvement and no threat to airway or swallowing.   PLAN:  I think Keflex is a fine choice, but he will need to use it four  times daily regularly.  I gave him a prescription for about 500 mg, #40  pills.  He declines analgesics.  He is okay to continue working.  He will  give me a progress report in five days to let me know if he is making  progress.  If he is deteriorating, we will see him more quickly.  If  he is doing well, we will see him back in three to four weeks.  Once the  infection has settled down, it may be appropriate to do an excision of the  sebaceous cyst, but not acutely unless he has more of an abscess develop.  I  discussed this with the patient and his wife, who understand and agree.                                               Gloris Manchester. Lazarus Salines, M.D.    KTW/MEDQ  D:  10/22/2002  T:  10/23/2002  Job:  [id number]   cc:   Lonzo Cloud. Kriste Basque, M.D. [facility]

## 2010-08-25 ENCOUNTER — Emergency Department (HOSPITAL_COMMUNITY): Payer: 59

## 2010-08-25 ENCOUNTER — Emergency Department (HOSPITAL_COMMUNITY)
Admission: EM | Admit: 2010-08-25 | Discharge: 2010-08-25 | Disposition: A | Discharge status: Home / Self Care | Payer: 59 | Attending: Emergency Medicine | Admitting: Emergency Medicine

## 2010-08-25 ENCOUNTER — Encounter (HOSPITAL_COMMUNITY): Payer: Self-pay | Admitting: Radiology

## 2010-08-25 ENCOUNTER — Emergency Department (HOSPITAL_COMMUNITY): Payer: No Typology Code available for payment source

## 2010-08-25 DIAGNOSIS — I10 Essential (primary) hypertension: Secondary | ICD-10-CM | POA: Insufficient documentation

## 2010-08-25 DIAGNOSIS — R51 Headache: Secondary | ICD-10-CM | POA: Insufficient documentation

## 2010-08-25 DIAGNOSIS — S022XXA Fracture of nasal bones, initial encounter for closed fracture: Secondary | ICD-10-CM | POA: Insufficient documentation

## 2010-08-25 DIAGNOSIS — Z23 Encounter for immunization: Secondary | ICD-10-CM | POA: Insufficient documentation

## 2010-08-25 HISTORY — DX: Essential (primary) hypertension: I10

## 2010-09-17 ENCOUNTER — Other Ambulatory Visit: Payer: Self-pay | Admitting: Pulmonary Disease

## 2010-09-28 ENCOUNTER — Other Ambulatory Visit: Payer: Self-pay | Admitting: *Deleted

## 2010-09-28 MED ORDER — ALPRAZOLAM 0.5 MG PO TABS
0.5000 mg | ORAL_TABLET | Freq: Three times a day (TID) | ORAL | Status: DC | PRN
Start: 2010-09-28 — End: 2011-04-12

## 2010-09-28 NOTE — Telephone Encounter (Signed)
Fax received from [facility] in [place] for Alprazolam 0.5 mg. Last filled on 08/16/2010. No pending appts and last OV was on 02/21/2010 w/ SN. Refills sent x1 only with note to call for an appt.

## 2010-10-14 ENCOUNTER — Encounter: Payer: Self-pay | Admitting: Internal Medicine

## 2010-10-14 ENCOUNTER — Telehealth: Payer: Self-pay | Admitting: Pulmonary Disease

## 2010-10-14 ENCOUNTER — Ambulatory Visit (INDEPENDENT_AMBULATORY_CARE_PROVIDER_SITE_OTHER): Payer: 59 | Admitting: Internal Medicine

## 2010-10-14 VITALS — BP 126/78 | HR 86 | Temp 98.2°F | Ht 73.0 in | Wt 178.0 lb

## 2010-10-14 DIAGNOSIS — I10 Essential (primary) hypertension: Secondary | ICD-10-CM

## 2010-10-14 DIAGNOSIS — N419 Inflammatory disease of prostate, unspecified: Secondary | ICD-10-CM

## 2010-10-14 MED ORDER — CIPROFLOXACIN HCL 500 MG PO TABS: ORAL_TABLET | ORAL | Status: DC

## 2010-10-14 NOTE — Patient Instructions (Signed)
Cipro 500 mg one half twice daily x 10 days then see Dr Kriste Basque in two weeks to regroup re longterm therapy for prostate enlargement which at this point is only mild to moderate   Bring all medications at visit, he want to change your blood pressure medication to help you urinate better

## 2010-10-14 NOTE — Telephone Encounter (Signed)
Spoke to pt's wife and she took appt for 3:30 today with dr wert

## 2010-10-14 NOTE — Telephone Encounter (Signed)
Will forward to Leigh to address when SN returns to see if okay to overbook him, or if SN okay with him seeing TP.
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
## 2010-10-15 ENCOUNTER — Encounter: Payer: Self-pay | Admitting: Internal Medicine

## 2010-10-15 DIAGNOSIS — N419 Inflammatory disease of prostate, unspecified: Secondary | ICD-10-CM | POA: Insufficient documentation

## 2010-10-15 NOTE — Assessment & Plan Note (Signed)
Nl psa 02/2010 and no evidence ca by exam so most likely this is bph/ prostatitis   Discussed in detail all the  indications, usual  risks and alternatives  relative to the benefits with patient who agrees to proceed with short course of cipro then regroup with Dr Kriste Basque to decide ? Urology referral.

## 2010-10-15 NOTE — Assessment & Plan Note (Signed)
May need to consider change to cardura to help with urinary flow if not better with course of cipro

## 2010-10-17 NOTE — Telephone Encounter (Signed)
appt set. Pt aware.Robert Nixon, CMA

## 2010-10-17 NOTE — Telephone Encounter (Signed)
OK TO ADD PT ON TO SN SCHEDULE FOR 7-27 AT 10:30.  THANKS

## 2010-10-28 ENCOUNTER — Ambulatory Visit (INDEPENDENT_AMBULATORY_CARE_PROVIDER_SITE_OTHER): Payer: No Typology Code available for payment source | Admitting: Pulmonary Disease

## 2010-10-28 ENCOUNTER — Encounter: Payer: Self-pay | Admitting: Pulmonary Disease

## 2010-10-28 DIAGNOSIS — IMO0002 Reserved for concepts with insufficient information to code with codable children: Secondary | ICD-10-CM

## 2010-10-28 DIAGNOSIS — N200 Calculus of kidney: Secondary | ICD-10-CM

## 2010-10-28 DIAGNOSIS — N419 Inflammatory disease of prostate, unspecified: Secondary | ICD-10-CM

## 2010-10-28 DIAGNOSIS — F17200 Nicotine dependence, unspecified, uncomplicated: Secondary | ICD-10-CM

## 2010-10-28 DIAGNOSIS — E7800 Pure hypercholesterolemia, unspecified: Secondary | ICD-10-CM

## 2010-10-28 DIAGNOSIS — D126 Benign neoplasm of colon, unspecified: Secondary | ICD-10-CM

## 2010-10-28 DIAGNOSIS — J209 Acute bronchitis, unspecified: Secondary | ICD-10-CM

## 2010-10-28 DIAGNOSIS — K5730 Diverticulosis of large intestine without perforation or abscess without bleeding: Secondary | ICD-10-CM

## 2010-10-28 DIAGNOSIS — F411 Generalized anxiety disorder: Secondary | ICD-10-CM

## 2010-10-28 DIAGNOSIS — I10 Essential (primary) hypertension: Secondary | ICD-10-CM
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
## 2010-10-30 ENCOUNTER — Encounter: Payer: Self-pay | Admitting: Pulmonary Disease

## 2010-10-30 NOTE — Patient Instructions (Signed)
Today we updated your med list in EPIC>    Continue your current meds the same...  Call for any problems...  Let's plan our usual f/u in the fall w/ CXR & fasting blood work.Marland KitchenMarland Kitchen

## 2010-11-11 ENCOUNTER — Other Ambulatory Visit: Payer: Self-pay | Admitting: Pulmonary Disease

## 2011-02-27 ENCOUNTER — Other Ambulatory Visit: Payer: Self-pay | Admitting: Pulmonary Disease

## 2011-04-12 ENCOUNTER — Other Ambulatory Visit: Payer: Self-pay | Admitting: *Deleted

## 2011-04-12 MED ORDER — ALPRAZOLAM 0.5 MG PO TABS: 0.5000 mg | ORAL_TABLET | Freq: Three times a day (TID) | ORAL | Status: DC | PRN

## 2011-04-26 ENCOUNTER — Ambulatory Visit (INDEPENDENT_AMBULATORY_CARE_PROVIDER_SITE_OTHER)
Admission: RE | Admit: 2011-04-26 | Discharge: 2011-04-26 | Disposition: A | Discharge status: Home / Self Care | Payer: 59 | Source: Ambulatory Visit | Attending: Pulmonary Disease | Admitting: Pulmonary Disease

## 2011-04-26 ENCOUNTER — Encounter: Payer: Self-pay | Admitting: Pulmonary Disease

## 2011-04-26 ENCOUNTER — Ambulatory Visit (INDEPENDENT_AMBULATORY_CARE_PROVIDER_SITE_OTHER): Payer: 59 | Admitting: Pulmonary Disease

## 2011-04-26 DIAGNOSIS — F17200 Nicotine dependence, unspecified, uncomplicated: Secondary | ICD-10-CM

## 2011-04-26 DIAGNOSIS — E7800 Pure hypercholesterolemia, unspecified: Secondary | ICD-10-CM

## 2011-04-26 DIAGNOSIS — IMO0002 Reserved for concepts with insufficient information to code with codable children: Secondary | ICD-10-CM

## 2011-04-26 DIAGNOSIS — F419 Anxiety disorder, unspecified: Secondary | ICD-10-CM

## 2011-04-26 DIAGNOSIS — J209 Acute bronchitis, unspecified: Secondary | ICD-10-CM

## 2011-04-26 DIAGNOSIS — I10 Essential (primary) hypertension: Secondary | ICD-10-CM

## 2011-04-26 DIAGNOSIS — K5730 Diverticulosis of large intestine without perforation or abscess without bleeding: Secondary | ICD-10-CM

## 2011-04-26 DIAGNOSIS — D126 Benign neoplasm of colon, unspecified: Secondary | ICD-10-CM

## 2011-04-26 DIAGNOSIS — N200 Calculus of kidney: Secondary | ICD-10-CM

## 2011-04-26 NOTE — Patient Instructions (Signed)
Today we updated your med list in our EPIC system...    Continue your current medications the same...  Today we did your follow up CXR... Please return to our lab one morning this week for your fasting blood work...    Then please call the [facility] in a few days for your results...    Dial [id number] & when prompted enter your patient number followed by the # symbol...    Your patient number is:  [id number]#  We gave you the 2012 Flu vaccine today...  Please work on smoking cessation & let me know if we can be of assistance in any way...  Call for any questions.Marland KitchenMarland Kitchen
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
## 2011-04-29 ENCOUNTER — Encounter: Payer: Self-pay | Admitting: Pulmonary Disease

## 2011-06-20 ENCOUNTER — Telehealth: Payer: Self-pay | Admitting: Pulmonary Disease

## 2011-06-20 MED ORDER — ALPRAZOLAM 0.5 MG PO TABS: 0.5000 mg | ORAL_TABLET | Freq: Three times a day (TID) | ORAL | Status: DC | PRN

## 2011-06-20 NOTE — Telephone Encounter (Signed)
[facility]  Alprazolam 0.5mg   Take 1 table tid as needed #90 No Known Allergies Dr Kriste Basque is this ok to fill  Thank you

## 2011-07-30 ENCOUNTER — Other Ambulatory Visit: Payer: Self-pay | Admitting: Pulmonary Disease

## 2011-08-21 ENCOUNTER — Telehealth: Payer: Self-pay | Admitting: Pulmonary Disease

## 2011-08-21 NOTE — Telephone Encounter (Signed)
Per SN okay to add pt on for next week.  I spoke with pt wife and since SN had a 12:00 opening on 08/29/11 pt is coming in at that time. Nothing further was needed

## 2011-08-21 NOTE — Telephone Encounter (Signed)
Spoke with the pt and he is asking for an appt with Dr. Kriste Basque sooner then first available due to 2 issues. The first is he is having burning and numbness in his feet x 3 weeks. Pt also wants to discuss some sexual performance issues he is having. He states he does not have an issue with getting an erection, but he is having premature ejaculation. He states this has happened the last several times with intercourse. He states this has never happened in the past. Pt is not comfortable seeing TP for this. Please advise on possible work in [employer]. Thanks. [name], CMA No Known Allergies

## 2011-08-29 ENCOUNTER — Other Ambulatory Visit (INDEPENDENT_AMBULATORY_CARE_PROVIDER_SITE_OTHER): Payer: 59

## 2011-08-29 ENCOUNTER — Ambulatory Visit (INDEPENDENT_AMBULATORY_CARE_PROVIDER_SITE_OTHER): Payer: 59 | Admitting: Pulmonary Disease

## 2011-08-29 ENCOUNTER — Encounter: Payer: Self-pay | Admitting: Pulmonary Disease

## 2011-08-29 VITALS — BP 148/80 | HR 78 | Temp 97.8°F | Ht 73.0 in | Wt 184.8 lb

## 2011-08-29 DIAGNOSIS — F411 Generalized anxiety disorder: Secondary | ICD-10-CM

## 2011-08-29 DIAGNOSIS — D126 Benign neoplasm of colon, unspecified: Secondary | ICD-10-CM

## 2011-08-29 DIAGNOSIS — I10 Essential (primary) hypertension: Secondary | ICD-10-CM

## 2011-08-29 DIAGNOSIS — F419 Anxiety disorder, unspecified: Secondary | ICD-10-CM

## 2011-08-29 DIAGNOSIS — IMO0002 Reserved for concepts with insufficient information to code with codable children: Secondary | ICD-10-CM

## 2011-08-29 DIAGNOSIS — G609 Hereditary and idiopathic neuropathy, unspecified: Secondary | ICD-10-CM

## 2011-08-29 DIAGNOSIS — G629 Polyneuropathy, unspecified: Secondary | ICD-10-CM

## 2011-08-29 DIAGNOSIS — N139 Obstructive and reflux uropathy, unspecified: Secondary | ICD-10-CM

## 2011-08-29 DIAGNOSIS — E7800 Pure hypercholesterolemia, unspecified: Secondary | ICD-10-CM

## 2011-08-29 DIAGNOSIS — N200 Calculus of kidney: Secondary | ICD-10-CM

## 2011-08-29 DIAGNOSIS — K5730 Diverticulosis of large intestine without perforation or abscess without bleeding: Secondary | ICD-10-CM

## 2011-08-29 LAB — CBC WITH DIFFERENTIAL/PLATELET
Basophils Absolute: 0.1 10*3/uL (ref 0.0–0.1)
Basophils Relative: 1 % (ref 0.0–3.0)
Eosinophils Absolute: 0.2 10*3/uL (ref 0.0–0.7)
HCT: 41.1 % (ref 39.0–52.0)
Hemoglobin: 13.7 g/dL (ref 13.0–17.0)
Lymphocytes Relative: 24.7 % (ref 12.0–46.0)
Lymphs Abs: 1.3 10*3/uL (ref 0.7–4.0)
MCHC: 33.4 g/dL (ref 30.0–36.0)
MCV: 93.5 fl (ref 78.0–100.0)
Monocytes Absolute: 0.4 10*3/uL (ref 0.1–1.0)
Neutro Abs: 3.3 10*3/uL (ref 1.4–7.7)
RBC: 4.4 Mil/uL (ref 4.22–5.81)
RDW: 12.8 % (ref 11.5–14.6)

## 2011-08-29 LAB — BASIC METABOLIC PANEL
CO2: 29 mEq/L (ref 19–32)
Calcium: 9.5 mg/dL (ref 8.4–10.5)
Creatinine, Ser: 1.1 mg/dL (ref 0.4–1.5)
GFR: 87.65 mL/min (ref >60.00)
Sodium: 140 mEq/L (ref 135–145)

## 2011-08-29 LAB — URINALYSIS, ROUTINE W REFLEX MICROSCOPIC
Bilirubin Urine: NEGATIVE
Hgb urine dipstick: NEGATIVE
Ketones, ur: NEGATIVE
Nitrite: NEGATIVE
Total Protein, Urine: NEGATIVE
Urine Glucose: NEGATIVE
pH: 6 (ref 5.0–8.0)

## 2011-08-29 LAB — HEPATIC FUNCTION PANEL
Alkaline Phosphatase: 73 U/L (ref 39–117)
Bilirubin, Direct: 0.2 mg/dL (ref 0.0–0.3)
Total Bilirubin: 0.9 mg/dL (ref 0.3–1.2)
Total Protein: 6.9 g/dL (ref 6.0–8.3)

## 2011-08-29 LAB — LIPID PANEL
HDL: 64.5 mg/dL (ref >39.00)
LDL Cholesterol: 88 mg/dL (ref 0–99)
Total CHOL/HDL Ratio: 3

## 2011-08-29 MED ORDER — TRAMADOL HCL 50 MG PO TABS: ORAL_TABLET | ORAL | Status: DC

## 2011-08-29 MED ORDER — GABAPENTIN 100 MG PO CAPS: ORAL_CAPSULE | ORAL | Status: DC
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
## 2011-08-29 NOTE — Patient Instructions (Signed)
Today we updated your med list in our EPIC system...    Continue your current medications the same...  We decided to start NEURONTIN (Gabapentin) 100mg  tabs for the burning/ numbness in your feet...    Start w/ one tab at bedtime and increase to 2 tabs after one week, then go to 3 tabs if needed...  We also wrote for ULTRAM (Tramadol) 50mg  to take 1-2 tabs as we discussed to see if it helps the symptom...  Call for any questions...  Let's plan a routine f/u in 6 months but call sooner if symptoms don't improve/ resolve on Rx.Marland KitchenMarland Kitchen

## 2011-12-01 ENCOUNTER — Other Ambulatory Visit: Payer: Self-pay | Admitting: Pulmonary Disease

## 2012-02-21 ENCOUNTER — Other Ambulatory Visit: Payer: Self-pay | Admitting: Pulmonary Disease

## 2012-02-23 ENCOUNTER — Telehealth: Payer: Self-pay | Admitting: Pulmonary Disease

## 2012-02-23 MED ORDER — ALPRAZOLAM 0.5 MG PO TABS: 0.5000 mg | ORAL_TABLET | Freq: Three times a day (TID) | ORAL | Status: DC | PRN

## 2012-02-23 NOTE — Telephone Encounter (Signed)
Refill called to the pharmacy for the alprazolam.  No further refills can be given without pt having an appt with SN.  Nothing further is needed.

## 2012-03-28 ENCOUNTER — Other Ambulatory Visit: Payer: Self-pay | Admitting: Pulmonary Disease

## 2012-05-30 ENCOUNTER — Other Ambulatory Visit (INDEPENDENT_AMBULATORY_CARE_PROVIDER_SITE_OTHER): Payer: 59

## 2012-05-30 ENCOUNTER — Encounter: Payer: Self-pay | Admitting: Pulmonary Disease

## 2012-05-30 ENCOUNTER — Ambulatory Visit (INDEPENDENT_AMBULATORY_CARE_PROVIDER_SITE_OTHER): Payer: 59 | Admitting: Pulmonary Disease

## 2012-05-30 ENCOUNTER — Ambulatory Visit (INDEPENDENT_AMBULATORY_CARE_PROVIDER_SITE_OTHER)
Admission: RE | Admit: 2012-05-30 | Discharge: 2012-05-30 | Disposition: A | Discharge status: Home / Self Care | Payer: 59 | Source: Ambulatory Visit | Attending: Pulmonary Disease | Admitting: Pulmonary Disease

## 2012-05-30 VITALS — BP 140/80 | HR 90 | Temp 98.3°F | Ht 73.0 in | Wt 184.2 lb

## 2012-05-30 DIAGNOSIS — K5730 Diverticulosis of large intestine without perforation or abscess without bleeding: Secondary | ICD-10-CM

## 2012-05-30 DIAGNOSIS — Z0000 Encounter for general adult medical examination without abnormal findings: Secondary | ICD-10-CM

## 2012-05-30 DIAGNOSIS — F17200 Nicotine dependence, unspecified, uncomplicated: Secondary | ICD-10-CM

## 2012-05-30 DIAGNOSIS — IMO0002 Reserved for concepts with insufficient information to code with codable children: Secondary | ICD-10-CM

## 2012-05-30 DIAGNOSIS — F411 Generalized anxiety disorder: Secondary | ICD-10-CM

## 2012-05-30 DIAGNOSIS — I10 Essential (primary) hypertension: Secondary | ICD-10-CM

## 2012-05-30 DIAGNOSIS — N200 Calculus of kidney: Secondary | ICD-10-CM

## 2012-05-30 DIAGNOSIS — D126 Benign neoplasm of colon, unspecified: Secondary | ICD-10-CM

## 2012-05-30 DIAGNOSIS — E7800 Pure hypercholesterolemia, unspecified: Secondary | ICD-10-CM

## 2012-05-30 LAB — HEPATIC FUNCTION PANEL
Albumin: 4.3 g/dL (ref 3.5–5.2)
Alkaline Phosphatase: 74 U/L (ref 39–117)
Total Protein: 7.6 g/dL (ref 6.0–8.3)

## 2012-05-30 LAB — LIPID PANEL
Total CHOL/HDL Ratio: 2
Triglycerides: 52 mg/dL (ref 0.0–149.0)

## 2012-05-30 LAB — BASIC METABOLIC PANEL
CO2: 30 mEq/L (ref 19–32)
Chloride: 100 mEq/L (ref 96–112)
Glucose, Bld: 98 mg/dL (ref 70–99)
Potassium: 3.8 mEq/L (ref 3.5–5.1)
Sodium: 137 mEq/L (ref 135–145)

## 2012-05-30 LAB — URINALYSIS, ROUTINE W REFLEX MICROSCOPIC
Hgb urine dipstick: NEGATIVE
Nitrite: NEGATIVE
Specific Gravity, Urine: <=1.005 (ref 1.000–1.030)
Urobilinogen, UA: 0.2 (ref 0.0–1.0)

## 2012-05-30 LAB — CBC WITH DIFFERENTIAL/PLATELET
Basophils Absolute: 0 10*3/uL (ref 0.0–0.1)
Eosinophils Relative: 2.2 % (ref 0.0–5.0)
HCT: 45.6 % (ref 39.0–52.0)
Hemoglobin: 15.4 g/dL (ref 13.0–17.0)
Lymphocytes Relative: 20.7 % (ref 12.0–46.0)
Lymphs Abs: 1.5 10*3/uL (ref 0.7–4.0)
Monocytes Relative: 7.1 % (ref 3.0–12.0)
Neutro Abs: 5 10*3/uL (ref 1.4–7.7)
RDW: 13.2 % (ref 11.5–14.6)
WBC: 7.2 10*3/uL (ref 4.5–10.5)

## 2012-05-30 LAB — TSH: TSH: 1.63 u[IU]/mL (ref 0.35–5.50)

## 2012-05-30 LAB — LDL CHOLESTEROL, DIRECT: Direct LDL: 109.4 mg/dL
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
## 2012-05-30 NOTE — Patient Instructions (Addendum)
Today we updated your med list in our EPIC system...    Continue your current medications the same...  Today we did your follow up CXR & FASTING blood work...    We will contact you w/ the results when avail...  We completed your Driver's forms as well...  Call for any questions...  Let's plan a routine f/u in 1 yr but call any time if needed for problems.Marland KitchenMarland Kitchen

## 2012-06-30 ENCOUNTER — Other Ambulatory Visit: Payer: Self-pay | Admitting: Pulmonary Disease

## 2012-09-26 ENCOUNTER — Other Ambulatory Visit: Payer: Self-pay | Admitting: Pulmonary Disease

## 2012-10-02 ENCOUNTER — Other Ambulatory Visit: Payer: Self-pay | Admitting: Pulmonary Disease

## 2012-10-14 ENCOUNTER — Other Ambulatory Visit: Payer: Self-pay | Admitting: Pulmonary Disease

## 2013-02-06 ENCOUNTER — Telehealth: Payer: Self-pay | Admitting: Pulmonary Disease

## 2013-02-06 MED ORDER — ALPRAZOLAM 0.5 MG PO TABS: 0.5000 mg | ORAL_TABLET | Freq: Three times a day (TID) | ORAL | Status: DC | PRN

## 2013-02-06 NOTE — Telephone Encounter (Signed)
I spoke with pt. Aware rx has been sent and needs to keep pending appt for further refill.s

## 2013-03-13 ENCOUNTER — Ambulatory Visit: Payer: 59 | Admitting: Pulmonary Disease

## 2013-03-25 ENCOUNTER — Encounter: Payer: Self-pay | Admitting: Pulmonary Disease

## 2013-03-25 ENCOUNTER — Ambulatory Visit (INDEPENDENT_AMBULATORY_CARE_PROVIDER_SITE_OTHER): Payer: Medicare Other | Admitting: Pulmonary Disease

## 2013-03-25 ENCOUNTER — Other Ambulatory Visit (INDEPENDENT_AMBULATORY_CARE_PROVIDER_SITE_OTHER): Payer: Medicare Other

## 2013-03-25 VITALS — BP 150/72 | HR 82 | Temp 98.1°F | Ht 73.0 in | Wt 195.2 lb

## 2013-03-25 DIAGNOSIS — E7800 Pure hypercholesterolemia, unspecified: Secondary | ICD-10-CM

## 2013-03-25 DIAGNOSIS — D179 Benign lipomatous neoplasm, unspecified: Secondary | ICD-10-CM

## 2013-03-25 DIAGNOSIS — D126 Benign neoplasm of colon, unspecified: Secondary | ICD-10-CM

## 2013-03-25 DIAGNOSIS — I10 Essential (primary) hypertension: Secondary | ICD-10-CM

## 2013-03-25 DIAGNOSIS — F411 Generalized anxiety disorder: Secondary | ICD-10-CM

## 2013-03-25 DIAGNOSIS — K5730 Diverticulosis of large intestine without perforation or abscess without bleeding: Secondary | ICD-10-CM

## 2013-03-25 DIAGNOSIS — F17200 Nicotine dependence, unspecified, uncomplicated: Secondary | ICD-10-CM

## 2013-03-25 DIAGNOSIS — N200 Calculus of kidney: Secondary | ICD-10-CM

## 2013-03-25 DIAGNOSIS — IMO0002 Reserved for concepts with insufficient information to code with codable children: Secondary | ICD-10-CM

## 2013-03-25 LAB — LIPID PANEL: Cholesterol: 178 mg/dL (ref 0–200)

## 2013-03-25 LAB — BASIC METABOLIC PANEL
BUN: 11 mg/dL (ref 6–23)
CO2: 28 mEq/L (ref 19–32)
Chloride: 107 mEq/L (ref 96–112)
Glucose, Bld: 84 mg/dL (ref 70–99)
Potassium: 4.9 mEq/L (ref 3.5–5.1)

## 2013-03-25 NOTE — Patient Instructions (Signed)
Today we updated your med list in our EPIC system...    Continue your current medications the same...  Start monitoring your BP at home>>    Check your BP about 2-3 times per week 7 write down the results...    Bring the results with you to the follow up visits...    Call me if your BP is regularly >150/90 so we can add medication...    In the meanwhile- remember: NO SALT & get the wt down a few lbs...  Today we did your follow up blood work...    We will contact you w/ the results when available...   Call for any questions...  Let's plan a follow up visit in 62mo, sooner if needed for problems.Marland KitchenMarland Kitchen
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
## 2013-03-31 ENCOUNTER — Telehealth: Payer: Self-pay | Admitting: Pulmonary Disease

## 2013-03-31 NOTE — Telephone Encounter (Signed)
Notes Recorded by Marcellus Scott, CMA on 03/28/2013 at 5:06 PM [email or web address] ------  Notes Recorded by Michele Mcalpine, MD on 03/26/2013 at 1:10 PM Please notify patient>  FLP looks great on diet + Simva20> continue same... Chems are wnl... Pt advised. [name], CMA

## 2013-04-03 ENCOUNTER — Other Ambulatory Visit: Payer: Self-pay | Admitting: Pulmonary Disease

## 2013-05-15 ENCOUNTER — Encounter: Payer: Self-pay | Admitting: Gastroenterology

## 2013-06-05 ENCOUNTER — Other Ambulatory Visit: Payer: Self-pay | Admitting: Pulmonary Disease

## 2013-06-05 NOTE — Telephone Encounter (Signed)
Pt last had alprazolam refilled 04/18/13 #90 x 0 refills Last ov 03/25/13 Pending 09/23/13 w/ SN Please advise regarding refill? thanks

## 2013-06-06 NOTE — Telephone Encounter (Signed)
Per SN--  Ok to refill xs 5 thanks

## 2013-06-10 ENCOUNTER — Encounter: Payer: Self-pay | Admitting: Gastroenterology

## 2013-06-17 ENCOUNTER — Telehealth: Payer: Self-pay | Admitting: Pulmonary Disease

## 2013-06-17 DIAGNOSIS — F411 Generalized anxiety disorder: Secondary | ICD-10-CM

## 2013-06-17 DIAGNOSIS — I10 Essential (primary) hypertension: Secondary | ICD-10-CM

## 2013-06-17 DIAGNOSIS — E7800 Pure hypercholesterolemia, unspecified: Secondary | ICD-10-CM

## 2013-06-17 DIAGNOSIS — F17200 Nicotine dependence, unspecified, uncomplicated: Secondary | ICD-10-CM

## 2013-06-17 DIAGNOSIS — N419 Inflammatory disease of prostate, unspecified: Secondary | ICD-10-CM

## 2013-06-17 DIAGNOSIS — G629 Polyneuropathy, unspecified: Secondary | ICD-10-CM

## 2013-06-17 DIAGNOSIS — IMO0002 Reserved for concepts with insufficient information to code with codable children: Secondary | ICD-10-CM

## 2013-06-17 NOTE — Telephone Encounter (Signed)
Called spoke with patient's spouse to discuss SN retiring from [facility] as of April 1 Pt has appt with SN on 6.26.15, this has been cancelled  Pt spouse stated she would like pt to be referred to [facility] as she has family that goes to that practice.  Office phone number given to spouse for them to call at their convenience.  Referral placed for documentation purposes.  Nothing further needed at this time; will sign off.

## 2013-07-22 ENCOUNTER — Ambulatory Visit (AMBULATORY_SURGERY_CENTER): Payer: Self-pay

## 2013-07-22 VITALS — Ht 73.0 in | Wt 189.8 lb

## 2013-07-22 DIAGNOSIS — Z860100 Personal history of colon polyps, unspecified: Secondary | ICD-10-CM

## 2013-07-22 MED ORDER — PREPOPIK 10-3.5-12 MG-GM-GM PO PACK: 1.0000 | PACK | ORAL | Status: DC

## 2013-07-22 NOTE — Progress Notes (Signed)
Per pt, no allergies to soy or egg products.Pt not taking any weight loss meds or using  O2 at home. 

## 2013-08-01 ENCOUNTER — Other Ambulatory Visit: Payer: Self-pay | Admitting: Pulmonary Disease

## 2013-08-05 ENCOUNTER — Encounter: Payer: Medicare Other | Admitting: Gastroenterology

## 2013-08-11 ENCOUNTER — Telehealth: Payer: Self-pay | Admitting: Pulmonary Disease

## 2013-08-11 MED ORDER — SILDENAFIL CITRATE 100 MG PO TABS: 100.0000 mg | ORAL_TABLET | ORAL | Status: DC | PRN

## 2013-08-11 NOTE — Telephone Encounter (Signed)
Rx for Viagra refilled x 1 only  Spoke with the pt and notified that this was done, and be sure and keep appt to see Burchette  Pt verbalized understanding and nothing further needed

## 2013-08-20 ENCOUNTER — Ambulatory Visit: Payer: Medicare Other | Admitting: Family Medicine

## 2013-09-23 ENCOUNTER — Ambulatory Visit: Payer: Medicare Other | Admitting: Pulmonary Disease

## 2013-10-07 ENCOUNTER — Other Ambulatory Visit: Payer: Self-pay | Admitting: Pulmonary Disease

## 2013-12-10 ENCOUNTER — Other Ambulatory Visit: Payer: Self-pay | Admitting: Pulmonary Disease

## 2013-12-18 ENCOUNTER — Other Ambulatory Visit: Payer: Self-pay | Admitting: Pulmonary Disease

## 2013-12-24 ENCOUNTER — Encounter: Payer: Self-pay | Admitting: Gastroenterology

## 2014-02-09 ENCOUNTER — Other Ambulatory Visit: Payer: Self-pay | Admitting: Pulmonary Disease

## 2014-02-23 ENCOUNTER — Ambulatory Visit (AMBULATORY_SURGERY_CENTER): Payer: Self-pay | Admitting: *Deleted

## 2014-02-23 VITALS — Ht 73.0 in | Wt 191.4 lb

## 2014-02-23 DIAGNOSIS — Z86010 Personal history of colonic polyps: Secondary | ICD-10-CM

## 2014-02-23 MED ORDER — MOVIPREP 100 G PO SOLR: 1.0000 | Freq: Once | ORAL | Status: DC

## 2014-02-23 NOTE — Progress Notes (Signed)
No egg or soy allergy. ewm No blood thinners, no diet pills. ewm No home 02 use. ewm No issues with past sedation. ewm Pt declined emmi video. ewm

## 2014-02-24 ENCOUNTER — Ambulatory Visit (INDEPENDENT_AMBULATORY_CARE_PROVIDER_SITE_OTHER): Payer: Medicare Other | Admitting: Family Medicine

## 2014-02-24 ENCOUNTER — Encounter: Payer: Self-pay | Admitting: Family Medicine

## 2014-02-24 VITALS — BP 160/80 | HR 69 | Temp 98.3°F | Wt 190.0 lb

## 2014-02-24 DIAGNOSIS — N5201 Erectile dysfunction due to arterial insufficiency: Secondary | ICD-10-CM

## 2014-02-24 DIAGNOSIS — Z720 Tobacco use: Secondary | ICD-10-CM

## 2014-02-24 DIAGNOSIS — I10 Essential (primary) hypertension: Secondary | ICD-10-CM

## 2014-02-24 DIAGNOSIS — E7800 Pure hypercholesterolemia, unspecified: Secondary | ICD-10-CM

## 2014-02-24 DIAGNOSIS — F17200 Nicotine dependence, unspecified, uncomplicated: Secondary | ICD-10-CM

## 2014-02-24 DIAGNOSIS — N529 Male erectile dysfunction, unspecified: Secondary | ICD-10-CM | POA: Insufficient documentation

## 2014-02-24 DIAGNOSIS — F411 Generalized anxiety disorder: Secondary | ICD-10-CM

## 2014-02-24 MED ORDER — HYDROCHLOROTHIAZIDE 25 MG PO TABS: 25.0000 mg | ORAL_TABLET | Freq: Every day | ORAL | Status: DC

## 2014-02-24 NOTE — Assessment & Plan Note (Signed)
Advised need for cessation. Patient not currently interested.

## 2014-02-24 NOTE — Assessment & Plan Note (Signed)
Poor control over last 3 visits and steadily worsening now up to systolic of 503. We will add hydrochlorothiazide 25 mg to amlodipine 10 mg. Follow-up in 2 weeks

## 2014-02-24 NOTE — Assessment & Plan Note (Signed)
Well-controlled on simvastatin 20 mg. Continue current medications- consider yearly labs.

## 2014-02-24 NOTE — Patient Instructions (Addendum)
Take 1/2 tablet for 3-4 days then increase to full tablet of hydrochlorothiazide.   See me back in 2 weeks for recheck  Great to meet you and have a Happy THanksgiving.   Give yourself an early Christmas gift and quit smoking!   Health Maintenance Due  Topic Date Due  . ZOSTAVAX/shingles- call your insurance 11/13/2007  . PNEUMOCOCCAL POLYSACCHARIDE VACCINE AGE 66 AND OVER - can give next visit 11/12/2012  . COLONOSCOPY - December 07/07/2013

## 2014-02-24 NOTE — Progress Notes (Signed)
Robert Reddish, MD Phone: [phone]  Subjective:  Patient presents today to establish care with me as their new primary care provider. Patient was formerly a patient of Dr. Lenna Nixon for 30 years. Chief complaint-noted.   Hypertension-poor control  BP Readings from Last 3 Encounters:  02/24/14 160/80  03/25/13 150/72  05/30/12 140/80  Home BP monitoring-checking at home 140/80 Compliant with medications-yes without side effects ROS-Denies any CP, HA, SOB, blurry vision, LE edema.   Anxiety ? Generalized Anxiety Disorder- stable Worries about children and wife in a difficult world and also worries about kids in the church.   Onset (min 6 months): 3 years  GAD 7 total: 7 while on xanax twice a day 1. Feeling nervous, anxious or on edge: 1 2. Not being able to stop or control worrying: 1 3. Worrying too much about different things:2 4. Trouble relaxing:0 5. Being so restless that it is hard to sit still:3 6. Becoming easily annoyed or irritable: 0 7. Feeling afraid as if something awful might happen:0  Patient denies history of  regular decongestants, albuterol . IS a smoker. Coffee one a day.  Denies traumatic events, abuse signifying PTSD  ROS- denies depressive symptoms. No SI HI  Hyperlipidemia-well controlled  Lab Results  Component Value Date   LDLCALC 93 03/25/2013  On statin: Simvastatin 20 mg Regular exercise: Very active but no plan cardio ROS- no chest pain or shortness of breath. No myalgias   Tobacco abuse Currently smoking 3/4 pack per day. Not interested in quitting at present. Had family members lived into their 31s or 100s while smoking ROS-no chest pain shortness breath as above  The following were reviewed and entered/updated in epic: Past Medical History  Diagnosis Date  . Hypertension   . Tobacco abuse   . Anxiety   . Benign neoplasm of colon   . Calculus of kidney   . Closed fracture of unspecified part of fibula   . Lipoma of unspecified site    . Pure hypercholesterolemia   . DIVERTICULOSIS OF COLON 05/20/2007    Qualifier: Diagnosis of  By: Robert Gilford MD, Deborra Medina    Patient Active Problem List   Diagnosis Date Noted  . CIGARETTE SMOKER 02/24/2009    Priority: High  . HYPERCHOLESTEROLEMIA 05/17/2007    Priority: Medium  . Anxiety state 05/17/2007    Priority: Medium  . Essential hypertension 05/17/2007    Priority: Medium  . Erectile dysfunction 02/24/2014    Priority: Low  . Peripheral neuropathy 08/29/2011    Priority: Low  . Prostatitis 10/15/2010    Priority: Low  . Degenerative disc disease, lumbar 06/02/2007    Priority: Low  . Lipoma 05/20/2007    Priority: Low  . COLONIC POLYPS 05/17/2007    Priority: Low   Past Surgical History  Procedure Laterality Date  . Inguinal hernia repair    . Ankle surgery      right  . Colonoscopy    . Polypectomy    . Nose surgery      Family History  Problem Relation Age of Onset  . Cancer Father     neck CA  . Hypertension Mother   . Colon cancer Maternal Uncle     Medications- reviewed and updated Current Outpatient Prescriptions  Medication Sig Dispense Refill  . amLODipine (NORVASC) 10 MG tablet TAKE 1 TABLET BY MOUTH EVERY DAY 30 tablet 8  . aspirin 81 MG tablet Take 81 mg by mouth daily.      . Multiple  Vitamins-Minerals (MULTIVITAMIN & MINERAL PO) Take 1 tablet by mouth daily.      . Omega-3 Fatty Acids (OMEGA-3 FISH OIL PO) Take 1 capsule by mouth daily.    . simvastatin (ZOCOR) 20 MG tablet TAKE 1 TABLET AT BEDTIME AS DIRECTED FOR CHOLESTEROL 30 tablet 4  . ALPRAZolam (XANAX) 0.5 MG tablet TAKE 1 TABLET 3 TIMES A DAY AS NEEDED SLEEP/ANXIETY (Patient not taking: Reported on 02/24/2014) 90 tablet 2  . sildenafil (VIAGRA) 100 MG tablet Take 1 tablet (100 mg total) by mouth as needed for erectile dysfunction. (Patient not taking: Reported on 02/24/2014) 10 tablet 0   Allergies-reviewed and updated No Known Allergies  History   Social History  . Marital  Status: Married    Spouse Name: Robert Nixon    Number of Children: N/A  . Years of Education: N/A   Social History Main Topics  . Smoking status: Current Every Day Smoker -- 0.05 packs/day for 12 years    Types: Cigarettes  . Smokeless tobacco: Never Used     Comment: Smoke 10-12 a day  . Alcohol Use: 1.2 - 1.8 oz/week    2-3 Glasses of wine per week     Comment: Drinks brandy at night on occ  . Drug Use: No  . Sexual Activity: Not on file   Other Topics Concern  . Not on file   Social History Narrative   Married. 3 kids. 5 grandkids.       Retired [place]: ride motorcycle    ROS--See HPI   Objective: BP 160/80 mmHg  Pulse 69  Temp(Src) 98.3 F (36.8 C)  Wt 190 lb (86.183 kg) Gen: NAD, resting comfortably in chair, moves easily to table CV: RRR no murmurs rubs or gallops Lungs: CTAB no crackles, wheeze, rhonchi Abdomen: soft/nontender/nondistended/normal bowel sounds.  Ext: no edema Skin: warm, dry, no rash Neuro: grossly normal, moves all extremities   Assessment/Plan:  Essential hypertension Poor control over last 3 visits and steadily worsening now up to systolic of 025. We will add hydrochlorothiazide 25 mg to amlodipine 10 mg. Follow-up in 2 weeks  Anxiety state No history of panic attacks. She GAD7 of 7 only. Patient is on Xanax up to twice a day with this score but I still doubt the diagnosis of generalized anxiety disorder. He primarily uses this medication for sleep. I asked him to only take it for sleep and will follow-up at next visit. In the long run we discussed I would like for him to come off this medication potentially and definitely would not use it for daily monotherapy of anxiety. Would consider SSRI if symptoms not well controlled off daily portion of his xanax. Hopeful to change this problem to insomnia in which case he has been stable and I have less concern of use.   HYPERCHOLESTEROLEMIA Well-controlled on simvastatin 20  mg. Continue current medications- consider yearly labs.  CIGARETTE SMOKER Advised need for cessation. Patient not currently interested.   Meds ordered this encounter  Medications  . hydrochlorothiazide (HYDRODIURIL) 25 MG tablet    Sig: Take 1 tablet (25 mg total) by mouth daily.    Dispense:  30 tablet    Refill:  3

## 2014-02-24 NOTE — Assessment & Plan Note (Signed)
No history of panic attacks. She GAD7 of 7 only. Patient is on Xanax up to twice a day with this score but I still doubt the diagnosis of generalized anxiety disorder. He primarily uses this medication for sleep. I asked him to only take it for sleep and will follow-up at next visit. In the long run we discussed I would like for him to come off this medication potentially and definitely would not use it for daily monotherapy of anxiety. Would consider SSRI if symptoms not well controlled off daily portion of his xanax. Hopeful to change this problem to insomnia in which case he has been stable and I have less concern of use.

## 2014-03-10 ENCOUNTER — Encounter: Payer: Self-pay | Admitting: Gastroenterology

## 2014-03-10 ENCOUNTER — Other Ambulatory Visit: Payer: Self-pay | Admitting: Pulmonary Disease

## 2014-03-10 ENCOUNTER — Ambulatory Visit (AMBULATORY_SURGERY_CENTER): Payer: Medicare Other | Admitting: Gastroenterology

## 2014-03-10 VITALS — BP 132/77 | HR 69 | Temp 97.1°F | Resp 16 | Ht 73.0 in | Wt 191.0 lb

## 2014-03-10 DIAGNOSIS — D123 Benign neoplasm of transverse colon: Secondary | ICD-10-CM

## 2014-03-10 DIAGNOSIS — K635 Polyp of colon: Secondary | ICD-10-CM

## 2014-03-10 DIAGNOSIS — D120 Benign neoplasm of cecum: Secondary | ICD-10-CM

## 2014-03-10 DIAGNOSIS — D125 Benign neoplasm of sigmoid colon: Secondary | ICD-10-CM

## 2014-03-10 DIAGNOSIS — Z86010 Personal history of colonic polyps: Secondary | ICD-10-CM

## 2014-03-10 HISTORY — PX: COLONOSCOPY: SHX174

## 2014-03-10 MED ORDER — SODIUM CHLORIDE 0.9 % IV SOLN: 500.0000 mL | INTRAVENOUS | Status: DC

## 2014-03-10 NOTE — Patient Instructions (Signed)
Please review your handouts on Polyps, Diverticulous and High Fiber.  Hold Apirin, Aspirin products and NSAIDS (Motrin, Advil, Aleve etc) for 2 weeks, March 24, 2014.    YOU HAD AN ENDOSCOPIC PROCEDURE TODAY AT [facility]: Refer to the procedure report that was given to you for any specific questions about what was found during the examination.  If the procedure report does not answer your questions, please call your gastroenterologist to clarify.  If you requested that your care partner not be given the details of your procedure findings, then the procedure report has been included in a sealed envelope for you to review at your convenience later.  YOU SHOULD EXPECT: Some feelings of bloating in the abdomen. Passage of more gas than usual.  Walking can help get rid of the air that was put into your GI tract during the procedure and reduce the bloating. If you had a lower endoscopy (such as a colonoscopy or flexible sigmoidoscopy) you may notice spotting of blood in your stool or on the toilet paper. If you underwent a bowel prep for your procedure, then you may not have a normal bowel movement for a few days.  DIET: Your first meal following the procedure should be a light meal and then it is ok to progress to your normal diet.  A half-sandwich or bowl of soup is an example of a good first meal.  Heavy or fried foods are harder to digest and may make you feel nauseous or bloated.  Likewise meals heavy in dairy and vegetables can cause extra gas to form and this can also increase the bloating.  Drink plenty of fluids but you should avoid alcoholic beverages for 24 hours.  ACTIVITY: Your care partner should take you home directly after the procedure.  You should plan to take it easy, moving slowly for the rest of the day.  You can resume normal activity the day after the procedure however you should NOT DRIVE or use heavy machinery for 24 hours (because of the sedation medicines used  during the test).    SYMPTOMS TO REPORT IMMEDIATELY: A gastroenterologist can be reached at any hour.  During normal business hours, 8:30 AM to 5:00 PM Monday through Friday, call [phone].  After hours and on weekends, please call the GI answering service at [phone] who will take a message and have the physician on call contact you.   Following lower endoscopy (colonoscopy or flexible sigmoidoscopy):  Excessive amounts of blood in the stool  Significant tenderness or worsening of abdominal pains  Swelling of the abdomen that is new, acute  Fever of 100F or higher FOLLOW UP: If any biopsies were taken you will be contacted by phone or by letter within the next 1-3 weeks.  Call your gastroenterologist if you have not heard about the biopsies in 3 weeks.  Our staff will call the home number listed on your records the next business day following your procedure to check on you and address any questions or concerns that you may have at that time regarding the information given to you following your procedure. This is a courtesy call and so if there is no answer at the home number and we have not heard from you through the emergency physician on call, we will assume that you have returned to your regular daily activities without incident.  SIGNATURES/CONFIDENTIALITY: You and/or your care partner have signed paperwork which will be entered into your electronic medical record.  These  signatures attest to the fact that that the information above on your After Visit Summary has been reviewed and is understood.  Full responsibility of the confidentiality of this discharge information lies with you and/or your care-partner. 

## 2014-03-10 NOTE — Progress Notes (Signed)
Report to PACU, RN, vss, BBS= Clear.  

## 2014-03-10 NOTE — Progress Notes (Signed)
Called to room to assist during endoscopic procedure.  Patient ID and intended procedure confirmed with present staff. Received instructions for my participation in the procedure from the performing physician.  

## 2014-03-11 ENCOUNTER — Telehealth: Payer: Self-pay | Admitting: *Deleted

## 2014-03-11 NOTE — Telephone Encounter (Signed)
  Follow up Call-  Call back number 03/10/2014  Post procedure Call Back phone  # [phone] (H)  Permission to leave phone message Yes   spoke with wife; pt was asleep  Patient questions:  Do you have a fever, pain , or abdominal swelling? No. Pain Score  0 *  Have you tolerated food without any problems? Yes.    Have you been able to return to your normal activities? Yes.    Do you have any questions about your discharge instructions: Diet   No. Medications  No. Follow up visit  No.  Do you have questions or concerns about your Care? No.  Actions: * If pain score is 4 or above: No action needed, pain <4.

## 2014-03-11 NOTE — Op Note (Signed)
[facility]  [address] [place], [address]   COLONOSCOPY PROCEDURE REPORT  PATIENT: Robert Nixon, Robert Nixon  MR#: [id number] BIRTHDATE: [birth year] , 62  yrs. old GENDER: male ENDOSCOPIST: Ladene Artist, MD, [facility] PROCEDURE DATE:  03/10/2014 PROCEDURE:   Colonoscopy with snare polypectomy First Screening Colonoscopy - Avg.  risk and is 50 yrs.  old or older - No.  Prior Negative Screening - Now for repeat screening. N/A  History of Adenoma - Now for follow-up colonoscopy & has been > or = to 3 yrs.  Yes hx of adenoma.  Has been 3 or more years since last colonoscopy.  Polyps Removed Today? Yes. ASA CLASS:   Class II INDICATIONS:surveillance colonoscopy based on a history of adenomatous colonic polyp(s). MEDICATIONS: Monitored anesthesia care and Propofol 300 mg IV DESCRIPTION OF PROCEDURE:   After the risks benefits and alternatives of the procedure were thoroughly explained, informed consent was obtained.  The digital rectal exam revealed no abnormalities of the rectum.   The LB [id number]  endoscope was introduced through the anus and advanced to the cecum, which was identified by both the appendix and ileocecal valve. No adverse events experienced.   The quality of the prep was good, using MoviPrep  The instrument was then slowly withdrawn as the colon was fully examined.  COLON FINDINGS: A sessile polyp measuring 10 mm in size was found at the cecum.  A polypectomy was performed using snare cautery.  The resection was complete, the polyp tissue was completely retrieved and sent to histology.   Two sessile polyps measuring 6 mm in size were found in the sigmoid colon and transverse colon.   There was moderate diverticulosis noted in the sigmoid colon and descending colon.   The examination was otherwise normal.  Retroflexed views revealed internal Grade I hemorrhoids. The time to cecum=4 minutes 36 seconds.  Withdrawal time=11 minutes 58 seconds.  The  scope was withdrawn and the procedure completed. COMPLICATIONS: There were no immediate complications.  ENDOSCOPIC IMPRESSION: 1.   Sessile polyp at the cecum; polypectomy performed using snare cautery 2.   Two sessile polyps in the sigmoid colon and transverse colon 3.   Moderate diverticulosis in the sigmoid colon and descending colon 4.   Grade l internal hemorrhoids  RECOMMENDATIONS: 1.  Hold Aspirin and all other NSAIDS for 2 weeks. 2.  Await pathology results 3.  High fiber diet with liberal fluid intake. 4.  Repeat Colonoscopy in 5 years.  eSigned:  Ladene Artist, MD, [facility] 03/10/2014 8:38 AM

## 2014-03-12 ENCOUNTER — Encounter: Payer: Self-pay | Admitting: Family Medicine

## 2014-03-12 ENCOUNTER — Ambulatory Visit (INDEPENDENT_AMBULATORY_CARE_PROVIDER_SITE_OTHER): Payer: Medicare Other | Admitting: Family Medicine

## 2014-03-12 VITALS — BP 154/84 | HR 80 | Temp 98.0°F | Wt 182.0 lb

## 2014-03-12 DIAGNOSIS — Z23 Encounter for immunization: Secondary | ICD-10-CM

## 2014-03-12 DIAGNOSIS — I10 Essential (primary) hypertension: Secondary | ICD-10-CM

## 2014-03-12 MED ORDER — HYDROCHLOROTHIAZIDE 25 MG PO TABS: 12.5000 mg | ORAL_TABLET | Freq: Every day | ORAL | Status: DC

## 2014-03-12 NOTE — Patient Instructions (Addendum)
Blood pressures at home and when not worried about blood pressure (stomach doctor) seem to be great in the 120-135 range.   Restart amlodipine 10mg  at night ([name] prescribed) Take 1/2 pill hydrochlorothiazide in morning.    If lightheadedness does not resolve, come see me to discuss other options.   We agreed to continue to monitor at home as long as you check for me at least once a week and that you will come in if regularly over 140/90. GIve me a call in about a month with your weekly checks.   Flu shot today.   Continue xanax for sleep alone. Glad anxiety is not worse.

## 2014-03-12 NOTE — Progress Notes (Signed)
Pre visit review using our clinic review tool, if applicable. No additional management support is needed unless otherwise documented below in the visit note. 

## 2014-03-12 NOTE — Addendum Note (Signed)
Addended by: Marin Olp on: 03/12/2014 05:56 PM   Modules accepted: Level of Service

## 2014-03-12 NOTE — Assessment & Plan Note (Signed)
Anxiety not worsening on xanax only for sleep

## 2014-03-12 NOTE — Assessment & Plan Note (Addendum)
Mild poor control. Blood pressures at homeseem to be great in the 120-135 range but SBP 154 today on HCTZ 25mg . Restart amlodipine 10mg  at night and cut HCTZ to 12.5mg  in AM since 25mg  caused some AM dizziness. AVS plan for follow up BP, otherwise 6 months for annual physical

## 2014-03-12 NOTE — Progress Notes (Signed)
[name], MD Phone: [phone]  Subjective:   Robert Nixon is a 68 y.o. year old very pleasant male patient who presents with the following:  Hypertension-mild poor control  BP Readings from Last 3 Encounters:  03/12/14 154/84  03/10/14 132/77  02/24/14 160/80  Started HCTZ and at 12.5mg  no lightheadedness, when got to 25mg  lightheadedness only in AM, stopped amlodipine even though plan was to start both Home BP monitoring-yes, in 120s or 130s at home and even 132 SBP before colonoscopy. Initial BP here 160/90.  ROS-Denies any CP, HA, SOB, blurry vision, LE edema.  Still smoking. Anxiety better.   Past Medical History- Patient Active Problem List   Diagnosis Date Noted  . CIGARETTE SMOKER 02/24/2009    Priority: High  . HYPERCHOLESTEROLEMIA 05/17/2007    Priority: Medium  . Anxiety state 05/17/2007    Priority: Medium  . Essential hypertension 05/17/2007    Priority: Medium  . Erectile dysfunction 02/24/2014    Priority: Low  . Peripheral neuropathy 08/29/2011    Priority: Low  . Prostatitis 10/15/2010    Priority: Low  . Degenerative disc disease, lumbar 06/02/2007    Priority: Low  . Lipoma 05/20/2007    Priority: Low  . COLONIC POLYPS 05/17/2007    Priority: Low   Medications- reviewed and updated Current Outpatient Prescriptions  Medication Sig Dispense Refill  . ALPRAZolam (XANAX) 0.5 MG tablet TAKE 1 TABLET 3 TIMES A DAY AS NEEDED SLEEP/ANXIETY 90 tablet 2  . amLODipine (NORVASC) 10 MG tablet TAKE 1 TABLET BY MOUTH EVERY DAY 30 tablet 0  . aspirin 81 MG tablet Take 81 mg by mouth daily.      . hydrochlorothiazide (HYDRODIURIL) 25 MG tablet Take 0.5 tablets (12.5 mg total) by mouth daily. 30 tablet 3  . Multiple Vitamins-Minerals (MULTIVITAMIN & MINERAL PO) Take 1 tablet by mouth daily.      . Omega-3 Fatty Acids (OMEGA-3 FISH OIL PO) Take 1 capsule by mouth daily.    . sildenafil (VIAGRA) 100 MG tablet Take 1 tablet (100 mg total) by mouth as  needed for erectile dysfunction. (Patient not taking: Reported on 02/24/2014) 10 tablet 0  . simvastatin (ZOCOR) 20 MG tablet TAKE 1 TABLET AT BEDTIME AS DIRECTED FOR CHOLESTEROL 30 tablet 4   No current facility-administered medications for this visit.    Objective: BP 154/84 mmHg  Pulse 80  Temp(Src) 98 F (36.7 C) (Oral)  Wt 182 lb (82.555 kg)  SpO2 96% Gen: NAD, resting comfortably in chair, smells of smoke CV: RRR no murmurs rubs or gallops Lungs: CTAB no crackles, wheeze, rhonchi Abdomen: soft/nontender/nondistended/normal bowel sounds.  Ext: no edema Skin: warm, dry, no rash   Assessment/Plan:  Essential hypertension Mild poor control. Blood pressures at homeseem to be great in the 120-135 range but SBP 154 today on HCTZ 25mg . Restart amlodipine 10mg  at night and cut HCTZ to 12.5mg  in AM since 25mg  caused some AM dizziness. AVS plan for follow up BP, otherwise 6 months for annual physical  Anxiety state Anxiety not worsening on xanax only for sleep  CIGARETTE SMOKER Still smoking. Encouraged to quit. He seems to be willing to try to cut back at least. Currently 3/4 PPD.    Orders Placed This Encounter  Procedures  . Flu vaccine HIGH DOSE PF (Fluzone Tri High dose)   Meds ordered this encounter  Medications  . hydrochlorothiazide (HYDRODIURIL) 25 MG tablet    Sig: Take 0.5 tablets (12.5 mg total) by mouth daily.  Dispense:  30 tablet    Refill:  3

## 2014-03-12 NOTE — Assessment & Plan Note (Signed)
Still smoking. Encouraged to quit. He seems to be willing to try to cut back at least. Currently 3/4 PPD.

## 2014-03-17 ENCOUNTER — Encounter: Payer: Self-pay | Admitting: Gastroenterology

## 2014-04-11 ENCOUNTER — Other Ambulatory Visit: Payer: Self-pay | Admitting: Pulmonary Disease

## 2014-06-23 ENCOUNTER — Other Ambulatory Visit: Payer: Self-pay | Admitting: Pulmonary Disease

## 2014-06-24 ENCOUNTER — Telehealth: Payer: Self-pay | Admitting: Family Medicine

## 2014-06-24 MED ORDER — ALPRAZOLAM 0.5 MG PO TABS: ORAL_TABLET | ORAL | Status: DC

## 2014-06-24 NOTE — Telephone Encounter (Signed)
Pt request refill ALPRAZolam (XANAX) 0.5 MG tablet  (1/tid)    Baker Janus Linna Hoff

## 2014-06-24 NOTE — Telephone Encounter (Signed)
Medication refilled

## 2014-06-29 ENCOUNTER — Telehealth: Payer: Self-pay | Admitting: Family Medicine

## 2014-06-29 NOTE — Telephone Encounter (Signed)
PA for Alprazolam was denied.  Patient's plan prefers Lorazepam.

## 2014-06-29 NOTE — Telephone Encounter (Signed)
Yes lorazepam 0.5mg  qhs prn

## 2014-06-29 NOTE — Telephone Encounter (Signed)
Is this ok?

## 2014-06-30 MED ORDER — LORAZEPAM 0.5 MG PO TABS: 0.5000 mg | ORAL_TABLET | Freq: Every day | ORAL | Status: DC

## 2014-06-30 NOTE — Telephone Encounter (Signed)
Medication called into [facility] 

## 2014-07-01 ENCOUNTER — Other Ambulatory Visit: Payer: Self-pay | Admitting: Family Medicine

## 2014-08-12 ENCOUNTER — Encounter: Payer: Self-pay | Admitting: Gastroenterology

## 2014-11-06 ENCOUNTER — Ambulatory Visit: Payer: Self-pay | Admitting: Family Medicine

## 2014-11-30 ENCOUNTER — Ambulatory Visit (INDEPENDENT_AMBULATORY_CARE_PROVIDER_SITE_OTHER): Payer: Medicare Other | Admitting: Family Medicine

## 2014-11-30 ENCOUNTER — Encounter: Payer: Self-pay | Admitting: Family Medicine

## 2014-11-30 VITALS — BP 170/94 | HR 84 | Temp 98.8°F | Wt 184.0 lb

## 2014-11-30 DIAGNOSIS — Z23 Encounter for immunization: Secondary | ICD-10-CM

## 2014-11-30 DIAGNOSIS — I10 Essential (primary) hypertension: Secondary | ICD-10-CM | POA: Diagnosis not present

## 2014-11-30 DIAGNOSIS — E7800 Pure hypercholesterolemia, unspecified: Secondary | ICD-10-CM

## 2014-11-30 DIAGNOSIS — F17200 Nicotine dependence, unspecified, uncomplicated: Secondary | ICD-10-CM

## 2014-11-30 DIAGNOSIS — Z720 Tobacco use: Secondary | ICD-10-CM

## 2014-11-30 DIAGNOSIS — N5201 Erectile dysfunction due to arterial insufficiency: Secondary | ICD-10-CM

## 2014-11-30 MED ORDER — AMLODIPINE BESYLATE 10 MG PO TABS: 10.0000 mg | ORAL_TABLET | Freq: Every day | ORAL | Status: DC

## 2014-11-30 MED ORDER — HYDROCHLOROTHIAZIDE 25 MG PO TABS: 25.0000 mg | ORAL_TABLET | Freq: Every day | ORAL | Status: DC

## 2014-11-30 NOTE — Progress Notes (Signed)
[name], MD  Subjective:  Robert Nixon is a 69 y.o. year old very pleasant male patient who presents with:  See problem oriented charting ROS- no chest pain or shortness of breath. Does have nocturia once a night and has been told of a large prostate before. No myalgias. Some anxiety in the daytime.  Past Medical History-  Patient Active Problem List   Diagnosis Date Noted  . CIGARETTE SMOKER 02/24/2009    Priority: High  . HYPERCHOLESTEROLEMIA 05/17/2007    Priority: Medium  . Anxiety state 05/17/2007    Priority: Medium  . Essential hypertension 05/17/2007    Priority: Medium  . Erectile dysfunction 02/24/2014    Priority: Low  . Peripheral neuropathy 08/29/2011    Priority: Low  . Prostatitis 10/15/2010    Priority: Low  . Degenerative disc disease, lumbar 06/02/2007    Priority: Low  . Lipoma 05/20/2007    Priority: Low  . COLONIC POLYPS 05/17/2007    Priority: Low   Medications- reviewed and updated Current Outpatient Prescriptions  Medication Sig Dispense Refill  . amLODipine (NORVASC) 10 MG tablet Take 1 tablet (10 mg total) by mouth daily. 30 tablet 11  . aspirin 81 MG tablet Take 81 mg by mouth daily.      . hydrochlorothiazide (HYDRODIURIL) 25 MG tablet Take 1 tablet (25 mg total) by mouth daily. 30 tablet 11  . LORazepam (ATIVAN) 0.5 MG tablet Take 1 tablet (0.5 mg total) by mouth at bedtime. 30 tablet 0  . Multiple Vitamins-Minerals (MULTIVITAMIN & MINERAL PO) Take 1 tablet by mouth daily.      . Omega-3 Fatty Acids (OMEGA-3 FISH OIL PO) Take 1 capsule by mouth daily.    . simvastatin (ZOCOR) 20 MG tablet TAKE 1 TABLET AT BEDTIME AS DIRECTED FOR CHOLESTEROL 30 tablet 4  . ALPRAZolam (XANAX) 0.5 MG tablet TAKE 1 TABLET 3 TIMES A DAY AS NEEDED SLEEP/ANXIETY (Patient not taking: Reported on 11/30/2014) 90 tablet 2  . sildenafil (VIAGRA) 100 MG tablet Take 1 tablet (100 mg total) by mouth as needed for erectile dysfunction. (Patient not taking: Reported  on 11/30/2014) 10 tablet 0   No current facility-administered medications for this visit.    Objective: BP 170/94 mmHg  Pulse 84  Temp(Src) 98.8 F (37.1 C)  Wt 184 lb (83.462 kg) Gen: NAD, resting comfortably, smells of smoke CV: RRR no murmurs rubs or gallops Lungs: CTAB no crackles, wheeze, rhonchi Abdomen: soft/nontender/nondistended/normal bowel sounds. No rebound or guarding.  Ext: no edema Skin: warm, dry Neuro: grossly normal, moves all extremities   Assessment/Plan:  CIGARETTE SMOKER S: Down to a half pack per day from three-quarter pack per day. 37 pack years calculated today A/P: Encourage cessation, patient not ready to quit. Considering lung cancer screening program.  HYPERCHOLESTEROLEMIA S: Likely poorly controlled. States he slacked on taking statin to about once a week A/P: Restart daily statin, lipids before physical   Essential hypertension S: Poorly controlled. Stopped taking his amlodipine. Only taking hydrochlorothiazide at night. A/P: Restart amlodipine and continue hydrochlorothiazide. One-month follow-up.   Erectile dysfunction S: not using viagra. Wants to discuss concerns about erections. Feels more societal pressure than desires to have sex, he is fine without having sex.  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 Declines testosterone testing. States likely just not to be active after prolonged discussion. Has viagra if needed prn     one-month blood pressure check, 6 month physical.  Orders Placed This Encounter  Procedures  . Pneumococcal conjugate  vaccine 13-valent    Meds ordered this encounter  Medications  . amLODipine (NORVASC) 10 MG tablet    Sig: Take 1 tablet (10 mg total) by mouth daily.    Dispense:  30 tablet    Refill:  11  . hydrochlorothiazide (HYDRODIURIL) 25 MG tablet    Sig: Take 1 tablet (25 mg total) by mouth daily.    Dispense:  30 tablet    Refill:  11

## 2014-11-30 NOTE — Assessment & Plan Note (Signed)
S: Likely poorly controlled. States he slacked on taking statin to about once a week A/P: Restart daily statin, lipids before physical

## 2014-11-30 NOTE — Patient Instructions (Signed)
You should be taking both amlodipine 10mg  and hydrochlorothiazide 25mg . Right now, you stated you are taking 25mg  hydrochlorothiazide at night. We will restart the amlodipine at 1/2 tab in the morning for 1 week then if doing ok increase to full tab. See me back in 1 month or sooner if dizziness or blood pressure not <140 on home checks  Schedule a physical for 6 months from now

## 2014-11-30 NOTE — Assessment & Plan Note (Signed)
S: Poorly controlled. Stopped taking his amlodipine. Only taking hydrochlorothiazide at night. A/P: Restart amlodipine and continue hydrochlorothiazide. One-month follow-up.

## 2014-11-30 NOTE — Assessment & Plan Note (Signed)
S: not using viagra. Wants to discuss concerns about erections. Feels more societal pressure than desires to have sex, he is fine without having sex.  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 Declines testosterone testing. States likely just not to be active after prolonged discussion. Has viagra if needed prn

## 2014-11-30 NOTE — Assessment & Plan Note (Addendum)
S: Down to a half pack per day from three-quarter pack per day. 37 pack years calculated today A/P: Encourage cessation, patient not ready to quit. Considering lung cancer screening program.

## 2014-12-31 ENCOUNTER — Encounter: Payer: Self-pay | Admitting: Family Medicine

## 2014-12-31 ENCOUNTER — Ambulatory Visit (INDEPENDENT_AMBULATORY_CARE_PROVIDER_SITE_OTHER): Payer: Medicare Other | Admitting: Family Medicine

## 2014-12-31 VITALS — BP 182/94 | HR 86 | Temp 98.2°F | Wt 180.0 lb

## 2014-12-31 DIAGNOSIS — I10 Essential (primary) hypertension: Secondary | ICD-10-CM

## 2014-12-31 MED ORDER — SIMVASTATIN 20 MG PO TABS: ORAL_TABLET | ORAL | Status: DC

## 2014-12-31 NOTE — Patient Instructions (Signed)
Take full amlodipine in the morning  Take full hydrochlorothiazide at night  Check blood pressure daily and make a log Bring your home cuff to next visit  See me in a month If at home you are regularly above 150/90, see me sooner

## 2014-12-31 NOTE — Assessment & Plan Note (Signed)
S: Very poorly controlled. Started back amlodipine in AM but only 1/2 tab- took just before he left. HCTZ compliant at night. Rushed to get here and is late/stressed from that. At home when he woke up was 147/88 BP Readings from Last 3 Encounters:  12/31/14 182/94  11/30/14 170/94  03/12/14 154/84  A/P: Suspect some anxiety contributing to higher reading today. Also just took amlodipine. We are going to have him increase to full tab in AM, continue hctz at night. Log for a month and return with home cuff. In addition if BP >150/90 at home will return sooner. Also continue to make sure restarts his statin- reinforced today

## 2014-12-31 NOTE — Progress Notes (Signed)
[name], MD  Subjective:  Robert Nixon is a 69 y.o. year old very pleasant male patient who presents for/with See problem oriented charting ROS- no chest pain, shortness of breath, headache, blurry vision, extremity weakness  Past Medical History-  Patient Active Problem List   Diagnosis Date Noted  . CIGARETTE SMOKER 02/24/2009    Priority: High  . HYPERCHOLESTEROLEMIA 05/17/2007    Priority: Medium  . Anxiety state 05/17/2007    Priority: Medium  . Essential hypertension 05/17/2007    Priority: Medium  . Erectile dysfunction 02/24/2014    Priority: Low  . Peripheral neuropathy 08/29/2011    Priority: Low  . Prostatitis 10/15/2010    Priority: Low  . Degenerative disc disease, lumbar 06/02/2007    Priority: Low  . Lipoma 05/20/2007    Priority: Low  . COLONIC POLYPS 05/17/2007    Priority: Low    Medications- reviewed and updated Current Outpatient Prescriptions  Medication Sig Dispense Refill  . amLODipine (NORVASC) 10 MG tablet Take 1 tablet (10 mg total) by mouth daily. 30 tablet 11  . aspirin 81 MG tablet Take 81 mg by mouth daily.      . hydrochlorothiazide (HYDRODIURIL) 25 MG tablet Take 1 tablet (25 mg total) by mouth daily. 30 tablet 11  . Multiple Vitamins-Minerals (MULTIVITAMIN & MINERAL PO) Take 1 tablet by mouth daily.      . Omega-3 Fatty Acids (OMEGA-3 FISH OIL PO) Take 1 capsule by mouth daily.    . simvastatin (ZOCOR) 20 MG tablet TAKE 1 TABLET AT BEDTIME AS DIRECTED FOR CHOLESTEROL 30 tablet 11  . LORazepam (ATIVAN) 0.5 MG tablet Take 1 tablet (0.5 mg total) by mouth at bedtime. (Patient not taking: Reported on 12/31/2014) 30 tablet 0   No current facility-administered medications for this visit.    Objective: BP 182/94 mmHg  Pulse 86  Temp(Src) 98.2 F (36.8 C)  Wt 180 lb (81.647 kg) Gen: NAD, resting comfortably PERRLA CV: RRR no murmurs rubs or gallops Lungs: CTAB no crackles, wheeze, rhonchi Abdomen:  soft/nontender/nondistended/normal bowel sounds. No rebound or guarding.  Ext: no edema Skin: warm, dry Neuro: grossly normal, moves all extremities, normal gait  Assessment/Plan:  Essential hypertension S: Very poorly controlled. Started back amlodipine in AM but only 1/2 tab- took just before he left. HCTZ compliant at night. Rushed to get here and is late/stressed from that. At home when he woke up was 147/88 BP Readings from Last 3 Encounters:  12/31/14 182/94  11/30/14 170/94  03/12/14 154/84  A/P: Suspect some anxiety contributing to higher reading today. Also just took amlodipine. We are going to have him increase to full tab in AM, continue hctz at night. Log for a month and return with home cuff. In addition if BP >150/90 at home will return sooner. Also continue to make sure restarts his statin- reinforced today    Emergent/strict Return precautions advised.   Meds ordered this encounter  Medications  . simvastatin (ZOCOR) 20 MG tablet    Sig: TAKE 1 TABLET AT BEDTIME AS DIRECTED FOR CHOLESTEROL    Dispense:  30 tablet    Refill:  11

## 2015-01-12 ENCOUNTER — Other Ambulatory Visit: Payer: Self-pay | Admitting: Family Medicine

## 2015-01-27 ENCOUNTER — Other Ambulatory Visit: Payer: Self-pay | Admitting: Family Medicine

## 2015-02-05 ENCOUNTER — Encounter: Payer: Self-pay | Admitting: Family Medicine

## 2015-02-05 ENCOUNTER — Ambulatory Visit (INDEPENDENT_AMBULATORY_CARE_PROVIDER_SITE_OTHER): Payer: Medicare Other | Admitting: Family Medicine

## 2015-02-05 VITALS — BP 142/88 | HR 94 | Temp 97.9°F | Wt 182.0 lb

## 2015-02-05 DIAGNOSIS — E7800 Pure hypercholesterolemia, unspecified: Secondary | ICD-10-CM

## 2015-02-05 DIAGNOSIS — Z23 Encounter for immunization: Secondary | ICD-10-CM

## 2015-02-05 DIAGNOSIS — I10 Essential (primary) hypertension: Secondary | ICD-10-CM

## 2015-02-05 DIAGNOSIS — F17200 Nicotine dependence, unspecified, uncomplicated: Secondary | ICD-10-CM

## 2015-02-05 NOTE — Assessment & Plan Note (Signed)
S: restarted statin after a period off. No myalgias. Complaint daily simvastatin 20mg  A/P: update lipids at CPE in 6 months

## 2015-02-05 NOTE — Patient Instructions (Addendum)
Flu shot received today.  Blood pressure looks much better. Goal at home <150/90. If above this when you check 4x a week, see me sooner  Otherwise, see me in 6 months for a physical- schedule at front desk  Thanks for taking cholesterol medicine

## 2015-02-05 NOTE — Progress Notes (Signed)
Robert Reddish, MD  Subjective:  Robert Nixon is a 69 y.o. year old very pleasant male patient who presents for/with See problem oriented charting ROS- No chest pain or shortness of breath. No headache or blurry vision. No myalgias.   Past Medical History-  Patient Active Problem List   Diagnosis Date Noted  . CIGARETTE SMOKER 02/24/2009    Priority: High  . HYPERCHOLESTEROLEMIA 05/17/2007    Priority: Medium  . Anxiety state 05/17/2007    Priority: Medium  . Essential hypertension 05/17/2007    Priority: Medium  . Erectile dysfunction 02/24/2014    Priority: Low  . Peripheral neuropathy ([place]) 08/29/2011    Priority: Low  . Prostatitis 10/15/2010    Priority: Low  . Degenerative disc disease, lumbar 06/02/2007    Priority: Low  . Lipoma 05/20/2007    Priority: Low  . COLONIC POLYPS 05/17/2007    Priority: Low    Medications- reviewed and updated Current Outpatient Prescriptions  Medication Sig Dispense Refill  . amLODipine (NORVASC) 10 MG tablet Take 1 tablet (10 mg total) by mouth daily. 30 tablet 11  . aspirin 81 MG tablet Take 81 mg by mouth daily.      . hydrochlorothiazide (HYDRODIURIL) 25 MG tablet Take 1 tablet (25 mg total) by mouth daily. 30 tablet 11  . Multiple Vitamins-Minerals (MULTIVITAMIN & MINERAL PO) Take 1 tablet by mouth daily.      . Omega-3 Fatty Acids (OMEGA-3 FISH OIL PO) Take 1 capsule by mouth daily.    . simvastatin (ZOCOR) 20 MG tablet TAKE 1 TABLET AT BEDTIME AS DIRECTED FOR CHOLESTEROL 30 tablet 11  . LORazepam (ATIVAN) 0.5 MG tablet Take 1 tablet (0.5 mg total) by mouth at bedtime. (Patient not taking: Reported on 02/05/2015) 30 tablet 0  . VIAGRA 100 MG tablet TAKE 1 TABLET (100 MG TOTAL) BY MOUTH AS NEEDED FOR ERECTILE DYSFUNCTION. (Patient not taking: Reported on 02/05/2015) 10 tablet 2   No current facility-administered medications for this visit.    Objective: BP 142/88 mmHg  Pulse 94  Temp(Src) 97.9 F (36.6 C)  Wt 182 lb  (82.555 kg) Gen: NAD, resting comfortably. Smells of smoke CV: RRR no murmurs rubs or gallops Lungs: CTAB no crackles, wheeze, rhonchi Abdomen: soft/nontender/nondistended/normal bowel sounds. Normal weight.  Ext: no edema Skin: warm, dry Neuro: grossly normal, moves all extremities  Assessment/Plan:  CIGARETTE SMOKER As always encouraged cessation. Patient declines  Essential hypertension S: controlled per jnc 8 goals. Improved with taking full amlodipine in AM in addition to hctz 25mg  in PM. Home reading 146/80 this Am. Mainly 140s over 80s. Wife checks for him  BP Readings from Last 3 Encounters:  02/05/15 142/88  12/31/14 182/94  11/30/14 170/94  A/P:Continue current meds given improved control. Advised at follow up to continue to log BP 4x a week and bring log as well as cuff. Return sooner if >150/90 at home   HYPERCHOLESTEROLEMIA S: restarted statin after a period off. No myalgias. Complaint daily simvastatin 20mg  A/P: update lipids at CPE in 6 months  6 months CPE  Orders Placed This Encounter  Procedures  . Flu Vaccine QUAD 36+ mos IM

## 2015-02-05 NOTE — Assessment & Plan Note (Signed)
S: controlled per jnc 8 goals. Improved with taking full amlodipine in AM in addition to hctz 25mg  in PM. Home reading 146/80 this Am. Mainly 140s over 80s. Wife checks for him  BP Readings from Last 3 Encounters:  02/05/15 142/88  12/31/14 182/94  11/30/14 170/94  A/P:Continue current meds given improved control. Advised at follow up to continue to log BP 4x a week and bring log as well as cuff. Return sooner if >150/90 at home

## 2015-02-05 NOTE — Assessment & Plan Note (Signed)
As always encouraged cessation. Patient declines

## 2015-04-06 ENCOUNTER — Other Ambulatory Visit: Payer: Self-pay | Admitting: Family Medicine

## 2015-04-06 NOTE — Telephone Encounter (Signed)
Refill ok? 

## 2015-04-06 NOTE — Telephone Encounter (Signed)
May refill lorazepam- that is what is on formulary and med list. Do not refill xanax

## 2015-06-30 ENCOUNTER — Ambulatory Visit: Payer: Medicare Other | Admitting: Family Medicine

## 2015-07-06 ENCOUNTER — Encounter: Payer: Self-pay | Admitting: Family Medicine

## 2015-07-07 ENCOUNTER — Encounter: Payer: Self-pay | Admitting: Family Medicine

## 2015-07-07 ENCOUNTER — Ambulatory Visit (INDEPENDENT_AMBULATORY_CARE_PROVIDER_SITE_OTHER): Payer: Medicare Other | Admitting: Family Medicine

## 2015-07-07 VITALS — BP 170/88 | HR 88 | Temp 98.5°F | Resp 16 | Ht 73.0 in | Wt 180.0 lb

## 2015-07-07 DIAGNOSIS — F17200 Nicotine dependence, unspecified, uncomplicated: Secondary | ICD-10-CM

## 2015-07-07 DIAGNOSIS — F411 Generalized anxiety disorder: Secondary | ICD-10-CM

## 2015-07-07 DIAGNOSIS — I10 Essential (primary) hypertension: Secondary | ICD-10-CM | POA: Diagnosis not present

## 2015-07-07 DIAGNOSIS — E7800 Pure hypercholesterolemia, unspecified: Secondary | ICD-10-CM | POA: Diagnosis not present

## 2015-07-07 LAB — CBC WITH DIFFERENTIAL/PLATELET
Basophils Absolute: 0 cells/uL (ref 0–200)
Basophils Relative: 0 %
EOS ABS: 116 {cells}/uL (ref 15–500)
Eosinophils Relative: 2 %
HEMATOCRIT: 43.6 % (ref 38.5–50.0)
Hemoglobin: 14.4 g/dL (ref 13.0–17.0)
LYMPHS PCT: 23 %
Lymphs Abs: 1334 cells/uL (ref 850–3900)
MCH: 31.4 pg (ref 27.0–33.0)
MCHC: 33 g/dL (ref 32.0–36.0)
MCV: 95 fL (ref 80.0–100.0)
MONO ABS: 348 {cells}/uL (ref 200–950)
MPV: 10.1 fL (ref 7.5–12.5)
Monocytes Relative: 6 %
NEUTROS PCT: 69 %
Neutro Abs: 4002 cells/uL (ref 1500–7800)
PLATELETS: 264 10*3/uL (ref 140–400)
RBC: 4.59 MIL/uL (ref 4.20–5.80)
RDW: 13 % (ref 11.0–15.0)
WBC: 5.8 10*3/uL (ref 3.8–10.8)

## 2015-07-07 LAB — TSH: TSH: 0.84 m[IU]/L (ref 0.40–4.50)

## 2015-07-07 LAB — LIPID PANEL
Cholesterol: 202 mg/dL — ABNORMAL HIGH (ref 125–200)
HDL: 71 mg/dL (ref >=40)
LDL CALC: 115 mg/dL (ref <130)
Total CHOL/HDL Ratio: 2.8 Ratio (ref <=5.0)
Triglycerides: 79 mg/dL (ref <150)
VLDL: 16 mg/dL (ref <30)

## 2015-07-07 LAB — COMPREHENSIVE METABOLIC PANEL
ALT: 19 U/L (ref 9–46)
AST: 20 U/L (ref 10–35)
Albumin: 4.1 g/dL (ref 3.6–5.1)
Alkaline Phosphatase: 64 U/L (ref 40–115)
BILIRUBIN TOTAL: 0.5 mg/dL (ref 0.2–1.2)
BUN: 18 mg/dL (ref 7–25)
CHLORIDE: 103 mmol/L (ref 98–110)
CO2: 28 mmol/L (ref 20–31)
CREATININE: 1.24 mg/dL (ref 0.70–1.25)
Calcium: 9.6 mg/dL (ref 8.6–10.3)
GLUCOSE: 94 mg/dL (ref 70–99)
Potassium: 4 mmol/L (ref 3.5–5.3)
SODIUM: 142 mmol/L (ref 135–146)
Total Protein: 7 g/dL (ref 6.1–8.1)

## 2015-07-07 MED ORDER — LORAZEPAM 0.5 MG PO TABS: 0.5000 mg | ORAL_TABLET | Freq: Every day | ORAL | Status: DC

## 2015-07-07 MED ORDER — AMLODIPINE BESYLATE 10 MG PO TABS: 10.0000 mg | ORAL_TABLET | Freq: Every day | ORAL | Status: DC

## 2015-07-07 MED ORDER — CLONIDINE HCL 0.1 MG PO TABS
0.1000 mg | ORAL_TABLET | Freq: Once | ORAL | Status: AC
  Administered 2015-07-07: 0.1 mg via ORAL

## 2015-07-07 MED ORDER — HYDROCHLOROTHIAZIDE 25 MG PO TABS: 25.0000 mg | ORAL_TABLET | Freq: Every day | ORAL | Status: DC

## 2015-07-07 NOTE — Assessment & Plan Note (Signed)
Restart ativan 0.5mg  at bedtime, he has been on for many years Previous xanax per notes Helps control his anxiety and helps with sleep

## 2015-07-07 NOTE — Progress Notes (Signed)
Patient ID: Robert Nixon, male   DOB: 25-Oct-1945, 70 y.o.   MRN: [id number]    Subjective:    Patient ID: Robert Nixon, male    DOB: 1945/06/19, 70 y.o.   MRN: [id number]  Patient presents for New Patient ~ Establish Care Here to establish care. He was briefly seen by [name] who is a pulmonologist who also had some primary care agents for about 30 years. After he retired he transitioned to [employer] he saw Dr. Yong Channel for about a year and a half. He has had hypertension for quite a few years. His blood pressures based on the previous records have range from 140s to 160s. He was on hydrochlorothiazide for quite some time and then amlodipine was added he has not been taking the amlodipine recently because he was concerned it caused dizziness. He also has a component of whitecoat hypertension and underlying anxiety. He states when he is on his anxiety medication that his blood pressure also elevates severely.  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 He does have a mild headache and feels a little lightheaded with his blood pressure being up states has been out like this for the past couple weeks. He denies any chest pain or shortness of breath.  Review of his chart he also has hyperlipidemia but he is not taking the simvastatin.  He is a smoker smokes 1 pack per day but he does not have any lung diseases he is aware of.    Review Of Systems:  GEN- denies fatigue, fever, weight loss,weakness, recent illness HEENT- denies eye drainage, change in vision, nasal discharge, CVS- denies chest pain, palpitations RESP- denies SOB, cough, wheeze ABD- denies N/V, change in stools, abd pain GU- denies dysuria, hematuria, dribbling, incontinence MSK- denies joint pain, muscle aches, injury Neuro- + headache, dizziness, syncope, seizure activity       Objective:    BP 170/88 mmHg  Pulse 88  Temp(Src) 98.5  F (36.9 C) (Oral)  Resp 16  Ht 6\' 1"  (1.854 m)  Wt 180 lb (81.647 kg)  BMI 23.75 kg/m2 GEN- NAD, alert and oriented x3 HEENT- PERRL, EOMI, non injected sclera, pink conjunctiva, MMM, oropharynx clear Neck- Supple, no thyromegaly CVS- RRR, no murmur RESP-CTAB ABD-NABS,soft,NT,ND Psych- normal affect and mood, not anxious appearing  EXT- No edema Pulses- Radial, DP- 2+   Prior to Clonidine BP 200/100 Right arm , 30 minutes after 170/88 EKG-NSR, no ST changes      Assessment & Plan:      Problem List Items Addressed This Visit    HYPERCHOLESTEROLEMIA   Relevant Medications   cloNIDine (CATAPRES) tablet 0.1 mg (Completed)   hydrochlorothiazide (HYDRODIURIL) 25 MG tablet   amLODipine (NORVASC) 10 MG tablet   Other Relevant Orders   Lipid panel   CIGARETTE SMOKER   Anxiety state    Restart ativan 0.5mg  at bedtime, he has been on for many years Previous xanax per notes Helps control his anxiety and helps with sleep       Relevant Medications   LORazepam (ATIVAN) 0.5 MG tablet    Other Visit Diagnoses    Malignant hypertension    -  Primary    Uncontrolled, given clonidine in office, EKG reassuring. Exam otherwise benign, labs drawn. Restart Norvasc 10mg  QHS, continue HCTZ  Recheck Monday    Relevant Medications    cloNIDine (CATAPRES) tablet 0.1 mg (Completed)  hydrochlorothiazide (HYDRODIURIL) 25 MG tablet    amLODipine (NORVASC) 10 MG tablet    Other Relevant Orders    CBC with Differential/Platelet    Comprehensive metabolic panel    TSH    EKG 12-Lead (Completed)       Note: This dictation was prepared with Dragon dictation along with smaller phrase technology. Any transcriptional errors that result from this process are unintentional.

## 2015-07-07 NOTE — Patient Instructions (Signed)
Take HCTZ 25mg  in morning Take amlodipine 10mg  at night We will discuss labs at visit F/U Monday or Tuesday AM for recheck on blood pressure

## 2015-07-13 ENCOUNTER — Ambulatory Visit (INDEPENDENT_AMBULATORY_CARE_PROVIDER_SITE_OTHER): Payer: Medicare Other | Admitting: Family Medicine

## 2015-07-13 ENCOUNTER — Encounter: Payer: Self-pay | Admitting: Family Medicine

## 2015-07-13 VITALS — BP 152/84 | HR 78 | Temp 99.2°F | Resp 14 | Ht 73.0 in | Wt 179.0 lb

## 2015-07-13 DIAGNOSIS — I10 Essential (primary) hypertension: Secondary | ICD-10-CM | POA: Diagnosis not present

## 2015-07-13 NOTE — Patient Instructions (Signed)
We will call with lab results  F/U 3 months  

## 2015-07-13 NOTE — Progress Notes (Signed)
Patient ID: Robert Nixon, male   DOB: Aug 12, 1945, 70 y.o.   MRN: [id number]   Subjective:    Patient ID: Robert Nixon, male    DOB: 24-Oct-1945, 70 y.o.   MRN: [id number]  Patient presents for F/U Patient here for blood pressure follow-up. He was seen a week ago at that time restarted back on amlodipine 10 mg in addition to his hydrochlorothiazide. I also restarted his nerve pill Ativan. He states his blood pressure home have looked very good it was 140s over low 80s. He has not had any chest pain or shortness of breath no side effects from the medications. We also reviewed his fasting labs which were all normal.    Review Of Systems:  GEN- denies fatigue, fever, weight loss,weakness, recent illness HEENT- denies eye drainage, change in vision, nasal discharge, CVS- denies chest pain, palpitations RESP- denies SOB, cough, wheeze ABD- denies N/V, change in stools, abd pain Neuro- denies headache, dizziness, syncope, seizure activity       Objective:    BP 162/84 mmHg  Pulse 78  Temp(Src) 99.2 F (37.3 C) (Oral)  Resp 14  Ht 6\' 1"  (1.854 m)  Wt 179 lb (81.194 kg)  BMI 23.62 kg/m2 GEN- NAD, alert and oriented x3 CVS- RRR, no murmur RESP-CTAB         Assessment & Plan:      Problem List Items Addressed This Visit    None      Note: This dictation was prepared with Dragon dictation along with smaller phrase technology. Any transcriptional errors that result from this process are unintentional.

## 2015-07-13 NOTE — Assessment & Plan Note (Signed)
Pressure is much improved from last week his systolic is down 40 points. 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 I would have him continue with the hydrochlorothiazide in the amlodipine is possible he may have a component of whitecoat hypertension as he does have underlying anxiety. He will continue to check his blood pressures at home. We will follow-up by phone in one week. If his blood pressure still quite elevated then I will consider adding lisinopril 10 mg to his current regimen.

## 2015-07-20 ENCOUNTER — Telehealth: Payer: Self-pay | Admitting: *Deleted

## 2015-07-20 NOTE — Telephone Encounter (Signed)
BP look good, no change to meds then

## 2015-07-20 NOTE — Telephone Encounter (Signed)
Call placed to patient.   Reports that he is away from monitor at this time and cannot give exact readings. States that BP has been running between 120-130/ 70-80.  MD to be made aware.

## 2015-07-20 NOTE — Telephone Encounter (Signed)
-----   Message from Alycia Rossetti, MD sent at 07/20/2015  2:51 PM EDT ----- Regarding: FW: F/U blood pressure Call pt, put in chart, how is BP running at home? ----- Message -----    From: Alycia Rossetti, MD    Sent: 07/19/2015      To: Alycia Rossetti, MD Subject: F/U blood pressure

## 2015-08-06 ENCOUNTER — Other Ambulatory Visit: Payer: Self-pay | Admitting: *Deleted

## 2015-08-06 ENCOUNTER — Encounter: Payer: Medicare Other | Admitting: Family Medicine

## 2015-08-06 MED ORDER — HYDROCHLOROTHIAZIDE 25 MG PO TABS: 25.0000 mg | ORAL_TABLET | Freq: Every day | ORAL | Status: DC

## 2015-08-06 NOTE — Telephone Encounter (Signed)
Received fax requesting refill on HCTZ.   Refill appropriate and filled per protocol. 

## 2016-02-03 ENCOUNTER — Other Ambulatory Visit: Payer: Self-pay | Admitting: Family Medicine

## 2016-02-03 NOTE — Telephone Encounter (Signed)
Ok to refill??  Last office visit 07/13/2015.  Last refill 07/07/2015, #3 refills.

## 2016-02-04 NOTE — Telephone Encounter (Signed)
Okay to refill? 

## 2016-02-07 NOTE — Telephone Encounter (Signed)
RX called in .

## 2016-02-09 ENCOUNTER — Telehealth: Payer: Self-pay | Admitting: Family Medicine

## 2016-02-09 ENCOUNTER — Other Ambulatory Visit: Payer: Self-pay | Admitting: Family Medicine

## 2016-02-09 NOTE — Telephone Encounter (Signed)
[facility] [address]  Refill on lorazepam if possible, has appt for cpe in December

## 2016-02-09 NOTE — Telephone Encounter (Signed)
Medication called in on 02/07/2016.

## 2016-02-10 ENCOUNTER — Telehealth: Payer: Self-pay | Admitting: *Deleted

## 2016-02-10 NOTE — Telephone Encounter (Signed)
Received request from pharmacy for PA on Ativan.  PA submitted.   Dx: F41.9- anxiety

## 2016-02-10 NOTE — Telephone Encounter (Signed)
Received call from [facility].   PA approved 02/10/2016- 02/09/2017.  Pharmacy made aware.

## 2016-03-08 ENCOUNTER — Encounter: Payer: Self-pay | Admitting: Family Medicine

## 2016-03-08 ENCOUNTER — Ambulatory Visit (INDEPENDENT_AMBULATORY_CARE_PROVIDER_SITE_OTHER): Payer: Medicare Other | Admitting: Family Medicine

## 2016-03-08 VITALS — BP 160/82 | HR 96 | Temp 99.7°F | Resp 16 | Ht 73.0 in | Wt 182.0 lb

## 2016-03-08 DIAGNOSIS — I10 Essential (primary) hypertension: Secondary | ICD-10-CM

## 2016-03-08 DIAGNOSIS — Z0000 Encounter for general adult medical examination without abnormal findings: Secondary | ICD-10-CM

## 2016-03-08 DIAGNOSIS — Z23 Encounter for immunization: Secondary | ICD-10-CM | POA: Diagnosis not present

## 2016-03-08 DIAGNOSIS — F17200 Nicotine dependence, unspecified, uncomplicated: Secondary | ICD-10-CM

## 2016-03-08 DIAGNOSIS — E7800 Pure hypercholesterolemia, unspecified: Secondary | ICD-10-CM | POA: Diagnosis not present

## 2016-03-08 DIAGNOSIS — Z125 Encounter for screening for malignant neoplasm of prostate: Secondary | ICD-10-CM

## 2016-03-08 DIAGNOSIS — N5201 Erectile dysfunction due to arterial insufficiency: Secondary | ICD-10-CM

## 2016-03-08 LAB — COMPREHENSIVE METABOLIC PANEL
ALBUMIN: 4.1 g/dL (ref 3.6–5.1)
ALK PHOS: 69 U/L (ref 40–115)
ALT: 16 U/L (ref 9–46)
AST: 21 U/L (ref 10–35)
BILIRUBIN TOTAL: 0.7 mg/dL (ref 0.2–1.2)
BUN: 15 mg/dL (ref 7–25)
CALCIUM: 9.6 mg/dL (ref 8.6–10.3)
CO2: 27 mmol/L (ref 20–31)
Chloride: 102 mmol/L (ref 98–110)
Creat: 1.16 mg/dL (ref 0.70–1.25)
GLUCOSE: 105 mg/dL — AB (ref 70–99)
Potassium: 4 mmol/L (ref 3.5–5.3)
Sodium: 139 mmol/L (ref 135–146)
Total Protein: 6.7 g/dL (ref 6.1–8.1)

## 2016-03-08 LAB — CBC WITH DIFFERENTIAL/PLATELET
BASOS PCT: 1 %
Basophils Absolute: 43 cells/uL (ref 0–200)
Eosinophils Absolute: 172 cells/uL (ref 15–500)
Eosinophils Relative: 4 %
HEMATOCRIT: 44.2 % (ref 38.5–50.0)
HEMOGLOBIN: 14.6 g/dL (ref 13.0–17.0)
Lymphocytes Relative: 31 %
Lymphs Abs: 1333 cells/uL (ref 850–3900)
MCH: 33 pg (ref 27.0–33.0)
MCHC: 33 g/dL (ref 32.0–36.0)
MCV: 99.8 fL (ref 80.0–100.0)
MONO ABS: 344 {cells}/uL (ref 200–950)
MPV: 9.8 fL (ref 7.5–12.5)
Monocytes Relative: 8 %
NEUTROS ABS: 2408 {cells}/uL (ref 1500–7800)
Neutrophils Relative %: 56 %
Platelets: 232 10*3/uL (ref 140–400)
RBC: 4.43 MIL/uL (ref 4.20–5.80)
RDW: 13.3 % (ref 11.0–15.0)
WBC: 4.3 10*3/uL (ref 3.8–10.8)

## 2016-03-08 LAB — LIPID PANEL
Cholesterol: 204 mg/dL — ABNORMAL HIGH (ref <200)
HDL: 85 mg/dL (ref >40)
LDL Cholesterol: 106 mg/dL — ABNORMAL HIGH (ref <100)
Total CHOL/HDL Ratio: 2.4 Ratio (ref <5.0)
Triglycerides: 63 mg/dL (ref <150)
VLDL: 13 mg/dL (ref <30)

## 2016-03-08 LAB — PSA: PSA: 1.1 ng/mL (ref <=4.0)

## 2016-03-08 MED ORDER — LISINOPRIL-HYDROCHLOROTHIAZIDE 20-25 MG PO TABS: 1.0000 | ORAL_TABLET | Freq: Every day | ORAL | 2 refills | Status: DC

## 2016-03-08 MED ORDER — ZOSTER VACCINE LIVE 19400 UNT/0.65ML ~~LOC~~ SUSR: 0.6500 mL | Freq: Once | SUBCUTANEOUS | 0 refills | Status: AC

## 2016-03-08 NOTE — Patient Instructions (Addendum)
F/U 1 month for blood pressure  We will call with lab results Shingles vaccine sent to pharmacy

## 2016-03-08 NOTE — Addendum Note (Signed)
Addended by: Sheral Flow on: 03/08/2016 12:50 PM   Modules accepted: Orders

## 2016-03-08 NOTE — Progress Notes (Signed)
Subjective:   Patient presents for Medicare Annual/Subsequent preventive examination.   Review Past Medical/Family/Social: Per EMR He does continue to have problems with erectile dysfunction and keeping his erections. He has been on Viagra but this has not helped.   Hypertension his blood pressure has been running up typically 150s to 160s he can tell when his blood pressures up that he does not have any chest pain shortness of breath or headache  Risk Factors  Current exercise habits: walks  Dietary issues discussed: Yes   Cardiac risk factors: Obesity (BMI >= 30 kg/m2). Smoker,HTN , Hyperlipidemia   Depression Screen  (Note: if answer to either of the following is "Yes", a more complete depression screening is indicated)  Over the past two weeks, have you felt down, depressed or hopeless? No Over the past two weeks, have you felt little interest or pleasure in doing things? No Have you lost interest or pleasure in daily life? No Do you often feel hopeless? No Do you cry easily over simple problems? No   Activities of Daily Living  In your present state of health, do you have any difficulty performing the following activities?:  Driving? No  Managing money? No  Feeding yourself? No  Getting from bed to chair? No  Climbing a flight of stairs? No  Preparing food and eating?: No  Bathing or showering? No  Getting dressed: No  Getting to the toilet? No  Using the toilet:No  Moving around from place to place: No  In the past year have you fallen or had a near fall?:No  Are you sexually active?yes  Do you have more than one partner? No   Hearing Difficulties: yes Do you often ask people to speak up or repeat themselves? No  Do you experience ringing or noises in your ears? No Do you have difficulty understanding soft or whispered voices? yes  Do you feel that you have a problem with memory? No Do you often misplace items? No  Do you feel safe at home? Yes  Cognitive Testing   Alert? Yes Normal Appearance?Yes  Oriented to person? Yes Place? Yes  Time? Yes  Recall of three objects? Yes  Can perform simple calculations? Yes  Displays appropriate judgment?Yes  Can read the correct time from a watch face?Yes   List the Names of Other Physician/Practitioners you currently use:  None currently   Screening Tests / Date Colonoscopy        UTD             Zostavax - Due  Pneumonia- Has had  Influenza Vaccine - Due f Tetanus/tdap UTD   ROS: GEN- + fatigue, denies fever, weight loss,weakness, recent illness HEENT- denies eye drainage, change in vision, nasal discharge, CVS- denies chest pain, palpitations RESP- denies SOB, cough, wheeze ABD- denies N/V, change in stools, abd pain GU- denies dysuria, hematuria, dribbling, incontinence MSK- denies joint pain, muscle aches, injury Neuro- denies headache, dizziness, syncope, seizure activity  Physical: GEN- NAD, alert and oriented x3 HEENT- PERRL, EOMI, non injected sclera, pink conjunctiva, MMM, oropharynx clear Neck- Supple, no thryomegaly, no bruit  CVS- RRR, no murmur RESP-CTAB ABD-NABS,soft,NT,ND  EXT- No edema Pulses- Radial, DP- 2+   Assessment:    Annual wellness medicare exam   Plan:    During the course of the visit the patient was educated and counseled about appropriate screening and preventive services including:  Flu shot given Shingles vaccine. Prescription given to that she can get the vaccine at the  pharmacy or Medicare part D.  Screen Neg  for depression.   His blood pressure is still uncontrolled and then add lisinopril 20 mg therefore changed to lisinopril HCTZ 20/12.5 he will continue with amlodipine. Seen last Will be checked today. X-ray  Also check a testosterone he likely just has low testosterone contributing to his erectile dysfunction that his blood pressure medications also have that side effect. If his testosterone is for some reason normal there will try him on  Cialis    Diet review for nutrition referral? Yes ____ Not Indicated __x__  Patient Instructions (the written plan) was given to the patient.  Medicare Attestation  I have personally reviewed:  The patient's medical and social history  Their use of alcohol, tobacco or illicit drugs  Their current medications and supplements  The patient's functional ability including ADLs,fall risks, home safety risks, cognitive, and hearing and visual impairment  Diet and physical activities  Evidence for depression or mood disorders  The patient's weight, height, BMI, and visual acuity have been recorded in the chart. I have made referrals, counseling, and provided education to the patient based on review of the above and I have provided the patient with a written personalized care plan for preventive services.

## 2016-03-08 NOTE — Assessment & Plan Note (Signed)
His blood pressure is still uncontrolled and then add lisinopril 20 mg therefore changed to lisinopril HCTZ 20/12.5 he will continue with amlodipine. Seen last Will be checked today. X-ray  Also check a testosterone he likely just has low testosterone contributing to his erectile dysfunction that his blood pressure medications also have that side effect. If his testosterone is for some reason normal there will try him on Cialis

## 2016-03-09 LAB — TESTOSTERONE: Testosterone: 534 ng/dL (ref 250–827)

## 2016-03-13 ENCOUNTER — Other Ambulatory Visit: Payer: Self-pay | Admitting: *Deleted

## 2016-03-13 MED ORDER — TADALAFIL 20 MG PO TABS: 20.0000 mg | ORAL_TABLET | Freq: Every day | ORAL | 2 refills | Status: DC | PRN

## 2016-03-22 ENCOUNTER — Telehealth: Payer: Self-pay | Admitting: *Deleted

## 2016-03-22 NOTE — Telephone Encounter (Signed)
Received VM from patient.   Report that he received letter from [facility] in regards to denial for services.   Claim number [id number].  Please contact patient for more information.   [phone].

## 2016-04-10 ENCOUNTER — Ambulatory Visit (INDEPENDENT_AMBULATORY_CARE_PROVIDER_SITE_OTHER): Payer: Medicare Other | Admitting: Family Medicine

## 2016-04-10 ENCOUNTER — Encounter: Payer: Self-pay | Admitting: Family Medicine

## 2016-04-10 VITALS — BP 156/86 | HR 80 | Temp 98.5°F | Resp 18 | Wt 184.0 lb

## 2016-04-10 DIAGNOSIS — N5201 Erectile dysfunction due to arterial insufficiency: Secondary | ICD-10-CM | POA: Diagnosis not present

## 2016-04-10 DIAGNOSIS — I10 Essential (primary) hypertension: Secondary | ICD-10-CM

## 2016-04-10 MED ORDER — TADALAFIL 20 MG PO TABS: 20.0000 mg | ORAL_TABLET | Freq: Every day | ORAL | 2 refills | Status: DC | PRN

## 2016-04-10 NOTE — Assessment & Plan Note (Signed)
Given cialsis script to try normal testosterone levels

## 2016-04-10 NOTE — Progress Notes (Signed)
   Subjective:    Patient ID: Robert Nixon, male    DOB: 02-13-1946, 71 y.o.   MRN: [id number]  Patient presents for 1 month follow up  Pt here to f/u hypertension at last visit started on lisinopril 20mg   In addition to  HCTZ, and norvasc. He states that he's felt well with the exception of a mild scratchy throat which occurred yesterday. He denies any cough congestion no fever no sinus drainage. His blood pressure at home is typically 140-145/80-85. He denies any headache shortness of breath chest pain dizziness. He has not had any side effects with the addition of the lisinopril. He is no new concerns today. His fasting labs from his physical a few weeks ago were reviewed at the bedside.  He would like to try the Cialis    Review Of Systems:  GEN- denies fatigue, fever, weight loss,weakness, recent illness HEENT- denies eye drainage, change in vision, nasal discharge, CVS- denies chest pain, palpitations RESP- denies SOB, cough, wheeze ABD- denies N/V, change in stools, abd pain GU- denies dysuria, hematuria, dribbling, incontinence MSK- denies joint pain, muscle aches, injury Neuro- denies headache, dizziness, syncope, seizure activity       Objective:    BP (!) 156/86 (BP Location: Right Arm, Patient Position: Sitting, Cuff Size: Normal)   Pulse 80   Temp 98.5 F (36.9 C) (Oral)   Resp 18   Wt 184 lb (83.5 kg)   BMI 24.28 kg/m  GEN- NAD, alert and oriented x3 HEENT- PERRL, EOMI, non injected sclera, pink conjunctiva, MMM, oropharynx clear, TM obscurred by wax  Neck- Supple, no  LAD  CVS- RRR, no murmur RESP-CTAB EXT- No edema Pulses- Radial, DP- 2+        Assessment & Plan:      Problem List Items Addressed This Visit    Essential hypertension - Primary    BP a little high at the visit, he has some component of whitecoat hypertension. His home readings look okay for his age. He will continue lisinopril HCTZ and amlodipine at the current dose.      Relevant Medications   tadalafil (CIALIS) 20 MG tablet   Erectile dysfunction    Given cialsis script to try normal testosterone levels         Note: This dictation was prepared with Dragon dictation along with smaller phrase technology. Any transcriptional errors that result from this process are unintentional.

## 2016-04-10 NOTE — Patient Instructions (Signed)
You can take the lisinipril HCTZ in the morning since this is a water pill You can take the norvasc at night  F/U 6 months

## 2016-04-10 NOTE — Assessment & Plan Note (Addendum)
BP a little high at the visit, he has some component of whitecoat hypertension. His home readings look okay for his age. He will continue lisinopril HCTZ and amlodipine at the current dose.  Mild sore throat no sign of acute infection at this time. Advised for lozenges saltwater gargling he may be in the beginning of an early viral illness with prior work this off with supportive care at home

## 2016-05-14 ENCOUNTER — Other Ambulatory Visit: Payer: Self-pay | Admitting: Family Medicine

## 2016-05-15 NOTE — Telephone Encounter (Signed)
Medication called to pharmacy. 

## 2016-05-15 NOTE — Telephone Encounter (Signed)
Ok to refill??  Last office visit 04/10/2016.  Last refill 02/07/2016.

## 2016-05-15 NOTE — Telephone Encounter (Signed)
okay

## 2016-05-30 ENCOUNTER — Other Ambulatory Visit: Payer: Self-pay | Admitting: Family Medicine

## 2016-06-28 ENCOUNTER — Other Ambulatory Visit: Payer: Self-pay | Admitting: Family Medicine

## 2016-06-28 NOTE — Telephone Encounter (Signed)
Okay to refill give 2 

## 2016-06-28 NOTE — Telephone Encounter (Signed)
Ok to refill??  Last office visit 04/10/2016.  Last refill 05/15/2016.

## 2016-06-29 NOTE — Telephone Encounter (Signed)
Medication called to pharmacy. 

## 2016-07-01 ENCOUNTER — Other Ambulatory Visit: Payer: Self-pay | Admitting: Family Medicine

## 2016-08-30 ENCOUNTER — Other Ambulatory Visit: Payer: Self-pay | Admitting: Family Medicine

## 2016-09-04 ENCOUNTER — Telehealth: Payer: Self-pay | Admitting: Family Medicine

## 2016-09-04 NOTE — Telephone Encounter (Signed)
Pt needs refill on lorazepam sent to [facility]

## 2016-09-05 MED ORDER — LORAZEPAM 0.5 MG PO TABS: 0.5000 mg | ORAL_TABLET | Freq: Every day | ORAL | 2 refills | Status: DC

## 2016-09-05 NOTE — Telephone Encounter (Signed)
Medication called to pharmacy. 

## 2016-09-05 NOTE — Telephone Encounter (Signed)
Okay to refill? 

## 2016-09-05 NOTE — Telephone Encounter (Signed)
Ok to refill??  Last office visit 04/10/2016.  Last refill 06/29/2016, #2 refills.

## 2016-09-08 ENCOUNTER — Telehealth: Payer: Self-pay | Admitting: Family Medicine

## 2016-09-08 MED ORDER — LISINOPRIL-HYDROCHLOROTHIAZIDE 20-25 MG PO TABS: 1.0000 | ORAL_TABLET | Freq: Every day | ORAL | 2 refills | Status: DC

## 2016-09-08 NOTE — Telephone Encounter (Signed)
Prescription sent to pharmacy.

## 2016-09-08 NOTE — Telephone Encounter (Signed)
Needs refill on lisinopril

## 2016-09-19 ENCOUNTER — Other Ambulatory Visit: Payer: Self-pay | Admitting: Family Medicine

## 2016-10-09 ENCOUNTER — Ambulatory Visit: Payer: Medicare Other | Admitting: Family Medicine

## 2017-02-09 ENCOUNTER — Ambulatory Visit (INDEPENDENT_AMBULATORY_CARE_PROVIDER_SITE_OTHER): Payer: Medicare Other

## 2017-02-09 DIAGNOSIS — Z23 Encounter for immunization: Secondary | ICD-10-CM

## 2017-02-09 NOTE — Progress Notes (Signed)
Patient was seen in office for a flu vaccine.Patient received the flu vaccine in his left deltoid.patient tolerated well

## 2017-02-24 ENCOUNTER — Other Ambulatory Visit: Payer: Self-pay | Admitting: Family Medicine

## 2017-02-26 NOTE — Telephone Encounter (Signed)
Medication filled x1 with no refills.   Requires office visit before any further refills can be given.   Letter sent.  

## 2017-03-21 ENCOUNTER — Encounter: Payer: Self-pay | Admitting: Family Medicine

## 2017-03-21 ENCOUNTER — Ambulatory Visit: Payer: Medicare Other | Admitting: Family Medicine

## 2017-03-21 VITALS — BP 172/78 | HR 82 | Temp 98.1°F | Resp 16 | Ht 73.0 in | Wt 183.0 lb

## 2017-03-21 DIAGNOSIS — F411 Generalized anxiety disorder: Secondary | ICD-10-CM

## 2017-03-21 DIAGNOSIS — I10 Essential (primary) hypertension: Secondary | ICD-10-CM

## 2017-03-21 DIAGNOSIS — E7800 Pure hypercholesterolemia, unspecified: Secondary | ICD-10-CM

## 2017-03-21 DIAGNOSIS — R7301 Impaired fasting glucose: Secondary | ICD-10-CM | POA: Diagnosis not present

## 2017-03-21 MED ORDER — LOSARTAN POTASSIUM-HCTZ 50-12.5 MG PO TABS: 1.0000 | ORAL_TABLET | Freq: Every day | ORAL | 3 refills | Status: DC

## 2017-03-21 MED ORDER — AMLODIPINE BESYLATE 10 MG PO TABS: 10.0000 mg | ORAL_TABLET | Freq: Every day | ORAL | 2 refills | Status: DC

## 2017-03-21 NOTE — Assessment & Plan Note (Signed)
Continues prn ativan use, last used 2 week ago, no sign of overuse

## 2017-03-21 NOTE — Patient Instructions (Addendum)
Change blood pressure medication to Losartan HCTZ  F/U 1 month- PHYSICAL

## 2017-03-21 NOTE — Progress Notes (Signed)
   Subjective:    Patient ID: Robert Nixon, male    DOB: 07/22/1945, 71 y.o.   MRN: [id number]  Patient presents for Follow-up (is fasting) and Medication Management (states that he is haing a dry cough and thinks it is SE of Lisinopril)  She had a follow-up chronic medical problems.  He has been having a dry cough for the past few he is concerned it may be related to his lisinopril. He is prescribed hydrochlorothiazide lisinopril and amlodipine.  His blood pressures at home he states are good He actually stopped the lisinopril HCTZ this past week, cough resolved, he did take norvasc His last visit was in January 2018  He also has underlying anxiety uses Lorazepam as needed For fasting labs   Review Of Systems:  GEN- denies fatigue, fever, weight loss,weakness, recent illness HEENT- denies eye drainage, change in vision, nasal discharge, CVS- denies chest pain, palpitations RESP- denies SOB, cough, wheeze ABD- denies N/V, change in stools, abd pain GU- denies dysuria, hematuria, dribbling, incontinence MSK- denies joint pain, muscle aches, injury Neuro- denies headache, dizziness, syncope, seizure activity       Objective:    BP (!) 172/78   Pulse 82   Temp 98.1 F (36.7 C) (Oral)   Resp 16   Ht 6\' 1"  (1.854 m)   Wt 183 lb (83 kg)   SpO2 97%   BMI 24.14 kg/m  GEN- NAD, alert and oriented x3 HEENT- PERRL, EOMI, non injected sclera, pink conjunctiva, MMM, oropharynx clear Neck- Supple, no thyromegaly CVS- RRR, no murmur RESP-CTAB EXT- No edema Pulses- Radial 2+        Assessment & Plan:      Problem List Items Addressed This Visit      Unprioritized   HYPERCHOLESTEROLEMIA    Check lipids, goal LDL < 120      Relevant Medications   losartan-hydrochlorothiazide (HYZAAR) 50-12.5 MG tablet   amLODipine (NORVASC) 10 MG tablet   Other Relevant Orders   Lipid panel   Essential hypertension - Primary    Uncontrolled, change to losartan HCTZ Continue  norvasc Recheck in 1 month      Relevant Medications   losartan-hydrochlorothiazide (HYZAAR) 50-12.5 MG tablet   amLODipine (NORVASC) 10 MG tablet   Other Relevant Orders   CBC with Differential/Platelet   Comprehensive metabolic panel   Anxiety state    Continues prn ativan use, last used 2 week ago, no sign of overuse         Note: This dictation was prepared with Dragon dictation along with smaller phrase technology. Any transcriptional errors that result from this process are unintentional.

## 2017-03-21 NOTE — Assessment & Plan Note (Signed)
Check lipids, goal LDL < 120

## 2017-03-21 NOTE — Assessment & Plan Note (Signed)
Uncontrolled, change to losartan HCTZ Continue norvasc Recheck in 1 month

## 2017-03-23 LAB — COMPREHENSIVE METABOLIC PANEL
AG Ratio: 1.6 (calc) (ref 1.0–2.5)
ALKALINE PHOSPHATASE (APISO): 87 U/L (ref 40–115)
ALT: 15 U/L (ref 9–46)
AST: 19 U/L (ref 10–35)
Albumin: 4.5 g/dL (ref 3.6–5.1)
BUN: 13 mg/dL (ref 7–25)
CHLORIDE: 105 mmol/L (ref 98–110)
CO2: 28 mmol/L (ref 20–32)
CREATININE: 1.1 mg/dL (ref 0.70–1.25)
Calcium: 9.8 mg/dL (ref 8.6–10.3)
GLOBULIN: 2.8 g/dL (ref 1.9–3.7)
Glucose, Bld: 131 mg/dL — ABNORMAL HIGH (ref 65–99)
Potassium: 4.4 mmol/L (ref 3.5–5.3)
Sodium: 140 mmol/L (ref 135–146)
Total Bilirubin: 0.8 mg/dL (ref 0.2–1.2)
Total Protein: 7.3 g/dL (ref 6.1–8.1)

## 2017-03-23 LAB — CBC WITH DIFFERENTIAL/PLATELET
BASOS ABS: 33 {cells}/uL (ref 0–200)
BASOS PCT: 0.5 %
EOS ABS: 178 {cells}/uL (ref 15–500)
Eosinophils Relative: 2.7 %
HCT: 40.9 % (ref 38.5–50.0)
Hemoglobin: 13.9 g/dL (ref 13.2–17.1)
Lymphs Abs: 1617 cells/uL (ref 850–3900)
MCH: 30.9 pg (ref 27.0–33.0)
MCHC: 34 g/dL (ref 32.0–36.0)
MCV: 90.9 fL (ref 80.0–100.0)
MPV: 10.1 fL (ref 7.5–12.5)
Monocytes Relative: 8.8 %
NEUTROS PCT: 63.5 %
Neutro Abs: 4191 cells/uL (ref 1500–7800)
PLATELETS: 262 10*3/uL (ref 140–400)
RBC: 4.5 10*6/uL (ref 4.20–5.80)
RDW: 12.5 % (ref 11.0–15.0)
TOTAL LYMPHOCYTE: 24.5 %
WBC: 6.6 10*3/uL (ref 3.8–10.8)
WBCMIX: 581 {cells}/uL (ref 200–950)

## 2017-03-23 LAB — TEST AUTHORIZATION

## 2017-03-23 LAB — HEMOGLOBIN A1C W/OUT EAG: HEMOGLOBIN A1C: 4.8 %{Hb} (ref <5.7)

## 2017-03-23 LAB — LIPID PANEL
CHOL/HDL RATIO: 2.3 (calc) (ref <5.0)
CHOLESTEROL: 227 mg/dL — AB (ref <200)
HDL: 98 mg/dL (ref >40)
LDL Cholesterol (Calc): 115 mg/dL (calc) — ABNORMAL HIGH
Non-HDL Cholesterol (Calc): 129 mg/dL (calc) (ref <130)
Triglycerides: 62 mg/dL (ref <150)

## 2017-03-26 ENCOUNTER — Encounter: Payer: Self-pay | Admitting: *Deleted

## 2017-04-24 ENCOUNTER — Encounter: Payer: Self-pay | Admitting: Family Medicine

## 2017-04-24 ENCOUNTER — Ambulatory Visit (INDEPENDENT_AMBULATORY_CARE_PROVIDER_SITE_OTHER): Payer: Medicare Other | Admitting: Family Medicine

## 2017-04-24 ENCOUNTER — Other Ambulatory Visit: Payer: Self-pay

## 2017-04-24 VITALS — BP 158/82 | HR 82 | Temp 98.2°F | Resp 14 | Ht 73.0 in | Wt 183.0 lb

## 2017-04-24 DIAGNOSIS — F17200 Nicotine dependence, unspecified, uncomplicated: Secondary | ICD-10-CM

## 2017-04-24 DIAGNOSIS — Z125 Encounter for screening for malignant neoplasm of prostate: Secondary | ICD-10-CM

## 2017-04-24 DIAGNOSIS — E7800 Pure hypercholesterolemia, unspecified: Secondary | ICD-10-CM

## 2017-04-24 DIAGNOSIS — I10 Essential (primary) hypertension: Secondary | ICD-10-CM

## 2017-04-24 DIAGNOSIS — Z0000 Encounter for general adult medical examination without abnormal findings: Secondary | ICD-10-CM

## 2017-04-24 DIAGNOSIS — F411 Generalized anxiety disorder: Secondary | ICD-10-CM

## 2017-04-24 NOTE — Patient Instructions (Signed)
F/U 6 months

## 2017-04-24 NOTE — Progress Notes (Signed)
Subjective:   Patient presents for Medicare Annual/Subsequent preventive examination.   Pt here for Medical wellness  Seen 1 month ago- bp medication changed to losartan HCTZ ,also on norvasc 10mg  daily   He has some white coat hypertension, Home readings-  130-140/70-80's, cough resolved off the ACEI, no blood pressure medication this morning   Anxiety- taking ativan as needed   Hyperlipidemia- taking fish oil, MVI, ASA   No Concerns  Review Past Medical/Family/Social: Per EMR    Risk Factors  Current exercise habits: walks Dietary issues discussed: yes  Cardiac risk factors: HTN, Hyperlipidemia   Depression Screen  (Note: if answer to either of the following is "Yes", a more complete depression screening is indicated)  Over the past two weeks, have you felt down, depressed or hopeless? No Over the past two weeks, have you felt little interest or pleasure in doing things? No Have you lost interest or pleasure in daily life? No Do you often feel hopeless? No Do you cry easily over simple problems? No   Activities of Daily Living  In your present state of health, do you have any difficulty performing the following activities?:  Driving? No  Managing money? No  Feeding yourself? No  Getting from bed to chair? No  Climbing a flight of stairs? No  Preparing food and eating?: No  Bathing or showering? No  Getting dressed: No  Getting to the toilet? No  Using the toilet:No  Moving around from place to place: No  In the past year have you fallen or had a near fall?:No  Are you sexually active? No  Do you have more than one partner? No   Hearing Difficulties:   Do you often ask people to speak up or repeat themselves? No  Do you experience ringing or noises in your ears? No Do you have difficulty understanding soft or whispered voices? Sometimes  Do you feel that you have a problem with memory? No Do you often misplace items? No  Do you feel safe at home? Yes  Cognitive  Testing  Alert? Yes Normal Appearance?Yes  Oriented to person? Yes Place? Yes  Time? Yes  Recall of three objects? Yes  Can perform simple calculations? Yes  Displays appropriate judgment?Yes  Can read the correct time from a watch face?Yes   List the Names of Other Physician/Practitioners you currently use:  Eye Doctor- has appt for Feb   Screening Tests / Date Colonoscopy         UTD            Zostavax  - Discussed  Pneumonia- UTD  Influenza Vaccine UTD Tetanus/tdap UTD  ROS GEN- denies fatigue, fever, weight loss,weakness, recent illness HEENT- denies eye drainage, change in vision, nasal discharge, CVS- denies chest pain, palpitations RESP- denies SOB, cough, wheeze ABD- denies N/V, change in stools, abd pain GU- denies dysuria, hematuria, dribbling, incontinence MSK- denies joint pain, muscle aches, injury Neuro- denies headache, dizziness, syncope, seizure activity  GEN- NAD, alert and oriented x3 HEENT- PERRL, EOMI, non injected sclera, pink conjunctiva, MMM, oropharynx clear Neck- Supple, no thryomegaly, No Bruit  CVS- RRR, no murmur RESP-CTAB ABD-NABS,soft,nt,nd EXT- No edema Pulses- Radial, DP- 2+    Assessment:    Annual wellness medicare exam   Plan:    During the course of the visit the patient was educated and counseled about appropriate screening and preventive services including:  Screening UTD except PSA, will obtain today  Shingles vaccine.Declines due to cost  Screen  Neg for depression.   Tobacco cessation  Pt has advanced directives   HTN- Bp improved though no meds taken this AM, also has component of white coat HTN  No change to meds Staying active , still works  Hyperlipidemia- mild elevation in LDL ,taking fish oil, no statin indicated at this time  Anxiety controlled prn ativan   Diet review for nutrition referral? Yes ____ Not Indicated __x__  Patient Instructions (the written plan) was given to the patient.  Medicare  Attestation  I have personally reviewed:  The patient's medical and social history  Their use of alcohol, tobacco or illicit drugs  Their current medications and supplements  The patient's functional ability including ADLs,fall risks, home safety risks, cognitive, and hearing and visual impairment  Diet and physical activities  Evidence for depression or mood disorders  The patient's weight, height, BMI, and visual acuity have been recorded in the chart. I have made referrals, counseling, and provided education to the patient based on review of the above and I have provided the patient with a written personalized care plan for preventive services.

## 2017-04-25 LAB — PSA: PSA: 1.2 ng/mL (ref <=4.0)

## 2017-04-26 ENCOUNTER — Encounter: Payer: Self-pay | Admitting: *Deleted

## 2017-05-24 ENCOUNTER — Telehealth: Payer: Self-pay | Admitting: *Deleted

## 2017-05-24 NOTE — Telephone Encounter (Signed)
Received fax requesting alternative to Losartan HCTZ. Medication is currently on backorder D/T recent recall.   MD please advise.

## 2017-05-25 MED ORDER — VALSARTAN-HYDROCHLOROTHIAZIDE 160-12.5 MG PO TABS: 1.0000 | ORAL_TABLET | Freq: Every day | ORAL | 1 refills | Status: DC

## 2017-05-25 NOTE — Telephone Encounter (Signed)
CHANGE TO Valsartan 160-12.5mg  once a day

## 2017-05-25 NOTE — Telephone Encounter (Signed)
Call placed to patient and patient made aware.   Prescription sent to pharmacy.  

## 2017-06-12 ENCOUNTER — Encounter: Payer: Self-pay | Admitting: Family Medicine

## 2017-06-12 ENCOUNTER — Other Ambulatory Visit: Payer: Self-pay

## 2017-06-12 ENCOUNTER — Ambulatory Visit: Payer: Medicare Other | Admitting: Family Medicine

## 2017-06-12 VITALS — BP 168/74 | HR 68 | Temp 98.0°F | Resp 14 | Ht 73.0 in | Wt 183.0 lb

## 2017-06-12 DIAGNOSIS — I10 Essential (primary) hypertension: Secondary | ICD-10-CM | POA: Diagnosis not present

## 2017-06-12 MED ORDER — LORAZEPAM 0.5 MG PO TABS: 0.5000 mg | ORAL_TABLET | Freq: Every day | ORAL | 2 refills | Status: DC

## 2017-06-12 MED ORDER — AMLODIPINE BESYLATE 10 MG PO TABS: 10.0000 mg | ORAL_TABLET | Freq: Every day | ORAL | 2 refills | Status: DC

## 2017-06-12 MED ORDER — VALSARTAN-HYDROCHLOROTHIAZIDE 160-12.5 MG PO TABS: 1.0000 | ORAL_TABLET | Freq: Every day | ORAL | 2 refills | Status: DC

## 2017-06-12 NOTE — Patient Instructions (Signed)
F/U Monday for recheck  Take norvasc in the evening, diovan in the morning

## 2017-06-12 NOTE — Progress Notes (Signed)
   Subjective:    Patient ID: Robert Nixon, male    DOB: 02/27/1946, 72 y.o.   MRN: [id number]  Patient presents for HTN (fluctuating BP- high up to 170- has been eating a lot of sodium lately)   Tried to give blood Saturday and BP was elevated   Last night wife checked BP 132/77  This morning 171/87 this morning at home  He did not realize that he has not been taking the norvasc in the evening   He has been drinking more coffee and eating more salt in foods       Review Of Systems:  GEN- denies fatigue, fever, weight loss,weakness, recent illness HEENT- denies eye drainage, change in vision, nasal discharge, CVS- denies chest pain, palpitations RESP- denies SOB, cough, wheeze ABD- denies N/V, change in stools, abd pain GU- denies dysuria, hematuria, dribbling, incontinence MSK- denies joint pain, muscle aches, injury Neuro- denies headache, dizziness, syncope, seizure activity       Objective:    BP (!) 168/74   Pulse 68   Temp 98 F (36.7 C) (Oral)   Resp 14   Ht 6\' 1"  (1.854 m)   Wt 183 lb (83 kg)   SpO2 98%   BMI 24.14 kg/m  GEN- NAD, alert and oriented x3 HEENT- PERRL, EOMI, non injected sclera, pink conjunctiva, MMM, oropharynx clear CVS- RRR, no murmur RESP-CTAB EXT- No edema Pulses- Radial 2+        Assessment & Plan:      Problem List Items Addressed This Visit      Unprioritized   Essential hypertension - Primary    He has some white coat HTN as stated before But even home readings elevated Restart norvasc, continue diovan HCT Recheck Monday BP, if still elevated will add low dose BB      Relevant Medications   amLODipine (NORVASC) 10 MG tablet   valsartan-hydrochlorothiazide (DIOVAN HCT) 160-12.5 MG tablet      Note: This dictation was prepared with Dragon dictation along with smaller phrase technology. Any transcriptional errors that result from this process are unintentional.

## 2017-06-13 ENCOUNTER — Telehealth: Payer: Self-pay | Admitting: *Deleted

## 2017-06-13 ENCOUNTER — Encounter: Payer: Self-pay | Admitting: Family Medicine

## 2017-06-13 NOTE — Telephone Encounter (Signed)
Received request from pharmacy for PA on Ativan.   PA submitted.   Dx: F41.1- Anxiety

## 2017-06-13 NOTE — Assessment & Plan Note (Signed)
He has some white coat HTN as stated before But even home readings elevated Restart norvasc, continue diovan HCT Recheck Monday BP, if still elevated will add low dose BB

## 2017-06-13 NOTE — Telephone Encounter (Signed)
Your information has been submitted to [employer] [place]. [employer] [place] will review the request and notify you of the determination decision directly, typically within 3 business days of your submission and once all necessary information is received.  You will also receive your request decision electronically. To check for an update later, open the request again from your dashboard.  If [employer] [place] has not responded within the specified timeframe or if you have any questions about your PA submission, contact [employer] [place] directly at (MAPD) [phone] or (PDP) [phone]. 

## 2017-06-14 NOTE — Telephone Encounter (Signed)
Received call from [facility] with [facility].   Was advised that PA has been approved.   Pharmacy made aware.

## 2017-06-18 ENCOUNTER — Ambulatory Visit (INDEPENDENT_AMBULATORY_CARE_PROVIDER_SITE_OTHER): Payer: Medicare Other | Admitting: Family Medicine

## 2017-06-18 ENCOUNTER — Encounter: Payer: Self-pay | Admitting: Family Medicine

## 2017-06-18 ENCOUNTER — Other Ambulatory Visit: Payer: Self-pay

## 2017-06-18 VITALS — BP 138/80 | HR 88 | Temp 98.0°F | Resp 14 | Ht 73.0 in | Wt 185.0 lb

## 2017-06-18 DIAGNOSIS — I10 Essential (primary) hypertension: Secondary | ICD-10-CM

## 2017-06-18 NOTE — Patient Instructions (Signed)
F/U as previous Continue both blood pressure medications

## 2017-06-18 NOTE — Assessment & Plan Note (Signed)
Pressure is much improved today.  We will continue current medication regimen.  He will call if his blood pressure does spike again.

## 2017-06-18 NOTE — Progress Notes (Signed)
   Subjective:    Patient ID: Robert Nixon, male    DOB: [birth year], 72 y.o.   MRN: [id number]  Patient presents for Follow-up (HTN- is not fasting)  Here to follow-up hypertension.  He was restarted on his amlodipine last week.  He is now taking both of his blood pressure medications.  His blood pressure at home is been from the 120s-130s over 70s to low 80s.  States that he feels well no side effects from the medication  Review Of Systems:  GEN- denies fatigue, fever, weight loss,weakness, recent illness HEENT- denies eye drainage, change in vision, nasal discharge, CVS- denies chest pain, palpitations RESP- denies SOB, cough, wheeze Neuro- denies headache, dizziness, syncope, seizure activity       Objective:    BP 138/80   Pulse 88   Temp 98 F (36.7 C) (Oral)   Resp 14   Ht 6\' 1"  (1.854 m)   Wt 185 lb (83.9 kg)   SpO2 98%   BMI 24.41 kg/m  GEN- NAD, alert and oriented x3 CVS- RRR, no murmur RESP-CTAB eXT- NO EDEMA        Assessment & Plan:      Problem List Items Addressed This Visit      Unprioritized   Essential hypertension - Primary    Pressure is much improved today.  We will continue current medication regimen.  He will call if his blood pressure does spike again.         Note: This dictation was prepared with Dragon dictation along with smaller phrase technology. Any transcriptional errors that result from this process are unintentional.

## 2017-10-23 ENCOUNTER — Other Ambulatory Visit: Payer: Self-pay

## 2017-10-23 ENCOUNTER — Ambulatory Visit: Payer: Medicare Other | Admitting: Family Medicine

## 2017-10-23 ENCOUNTER — Encounter: Payer: Self-pay | Admitting: Family Medicine

## 2017-10-23 VITALS — BP 132/62 | HR 68 | Temp 97.8°F | Resp 14 | Ht 73.0 in | Wt 181.0 lb

## 2017-10-23 DIAGNOSIS — F17200 Nicotine dependence, unspecified, uncomplicated: Secondary | ICD-10-CM

## 2017-10-23 DIAGNOSIS — H6122 Impacted cerumen, left ear: Secondary | ICD-10-CM

## 2017-10-23 DIAGNOSIS — I10 Essential (primary) hypertension: Secondary | ICD-10-CM | POA: Diagnosis not present

## 2017-10-23 DIAGNOSIS — F411 Generalized anxiety disorder: Secondary | ICD-10-CM

## 2017-10-23 DIAGNOSIS — E7800 Pure hypercholesterolemia, unspecified: Secondary | ICD-10-CM

## 2017-10-23 MED ORDER — AMLODIPINE BESYLATE 10 MG PO TABS: 10.0000 mg | ORAL_TABLET | Freq: Every day | ORAL | 2 refills | Status: DC

## 2017-10-23 MED ORDER — VALSARTAN-HYDROCHLOROTHIAZIDE 160-12.5 MG PO TABS: 1.0000 | ORAL_TABLET | Freq: Every day | ORAL | 2 refills | Status: DC

## 2017-10-23 NOTE — Assessment & Plan Note (Signed)
Mild elevated LDL, plan for repeat lipids at CPE in December

## 2017-10-23 NOTE — Assessment & Plan Note (Signed)
counseled on need for cessation, he has cut back some, not ready to quit

## 2017-10-23 NOTE — Patient Instructions (Signed)
F/U December/Jan for Physical

## 2017-10-23 NOTE — Assessment & Plan Note (Signed)
Prn use of ativan, not daily No changes

## 2017-10-23 NOTE — Assessment & Plan Note (Signed)
Well controlled no changes 

## 2017-10-23 NOTE — Progress Notes (Signed)
   Subjective:    Patient ID: Robert Nixon, male    DOB: 1945/04/06, 72 y.o.   MRN: [id number]  Patient presents for Follow-up (is not fasting)   HTN- taking BP medications as prescribed, no SE with meds, states BP has been good   Left ear clogged- past few weeks, no drainage, has some occ  nasal drainage on and off, no cough   Chronic insomnia/anxiety - taking ativan as needed    Review Of Systems:  GEN- denies fatigue, fever, weight loss,weakness, recent illness HEENT- denies eye drainage, change in vision, nasal discharge, CVS- denies chest pain, palpitations RESP- denies SOB, cough, wheeze ABD- denies N/V, change in stools, abd pain GU- denies dysuria, hematuria, dribbling, incontinence MSK- denies joint pain, muscle aches, injury Neuro- denies headache, dizziness, syncope, seizure activity       Objective:    BP 132/62   Pulse 68   Temp 97.8 F (36.6 C) (Oral)   Resp 14   Ht 6\' 1"  (1.854 m)   Wt 181 lb (82.1 kg)   SpO2 97%   BMI 23.88 kg/m  GEN- NAD, alert and oriented x3 HEENT- PERRL, EOMI, non injected sclera, pink conjunctiva, MMM, oropharynx clear, impacted Left canal, s/p irrigation TM clear, canal clear, Right canal clear, TM in tact  Neck- Supple, no thyromegaly CVS- RRR, no murmur RESP-CTAB EXT- No edema Pulses- Radial  2+        Assessment & Plan:      Problem List Items Addressed This Visit      Unprioritized   Anxiety state    Prn use of ativan, not daily No changes       CIGARETTE SMOKER    counseled on need for cessation, he has cut back some, not ready to quit       Essential hypertension    Well controlled no changes       HYPERCHOLESTEROLEMIA    Mild elevated LDL, plan for repeat lipids at [facility] in December        Other Visit Diagnoses    Left ear impacted cerumen    -  Primary   s/p irrigation at bedside,removal of wax      Note: This dictation was prepared with Dragon dictation along with smaller phrase  technology. Any transcriptional errors that result from this process are unintentional.

## 2017-12-30 ENCOUNTER — Other Ambulatory Visit: Payer: Self-pay | Admitting: Family Medicine

## 2018-01-01 NOTE — Telephone Encounter (Signed)
Requesting refill    Lorezapem  LOV: 10/23/17  LRF:  06/12/17

## 2018-04-30 ENCOUNTER — Encounter: Payer: Self-pay | Admitting: Family Medicine

## 2018-04-30 ENCOUNTER — Ambulatory Visit (INDEPENDENT_AMBULATORY_CARE_PROVIDER_SITE_OTHER): Payer: Medicare Other | Admitting: Family Medicine

## 2018-04-30 ENCOUNTER — Other Ambulatory Visit: Payer: Self-pay

## 2018-04-30 VITALS — BP 138/68 | HR 82 | Temp 98.4°F | Resp 14 | Ht 73.0 in | Wt 183.0 lb

## 2018-04-30 DIAGNOSIS — Z125 Encounter for screening for malignant neoplasm of prostate: Secondary | ICD-10-CM | POA: Diagnosis not present

## 2018-04-30 DIAGNOSIS — I10 Essential (primary) hypertension: Secondary | ICD-10-CM

## 2018-04-30 DIAGNOSIS — E7800 Pure hypercholesterolemia, unspecified: Secondary | ICD-10-CM

## 2018-04-30 DIAGNOSIS — F411 Generalized anxiety disorder: Secondary | ICD-10-CM

## 2018-04-30 DIAGNOSIS — Z0000 Encounter for general adult medical examination without abnormal findings: Secondary | ICD-10-CM

## 2018-04-30 DIAGNOSIS — F17200 Nicotine dependence, unspecified, uncomplicated: Secondary | ICD-10-CM

## 2018-04-30 DIAGNOSIS — Z789 Other specified health status: Secondary | ICD-10-CM

## 2018-04-30 MED ORDER — LORAZEPAM 0.5 MG PO TABS: 0.5000 mg | ORAL_TABLET | Freq: Every day | ORAL | 0 refills | Status: DC

## 2018-04-30 NOTE — Progress Notes (Signed)
Subjective:   Patient presents for Medicare Annual/Subsequent preventive examination.     Pt here for wellness exam, medications reviewed    Chronic anxiety- takes ativan as needed    Hyperlipidemia- taking fish oil    HTN- taking norvasc, valstartan-HCTZ daily, BP at home       Tobacco- does not smoke in the home, smokes at work about 10-12 cig  No concerns    Review Past Medical/Family/Social: Per EMR    Risk Factors  Current exercise habits: walks Dietary issues discussed: Yes  Cardiac risk factors: HTN, Hyperlipidemia   Depression Screen  (Note: if answer to either of the following is "Yes", a more complete depression screening is indicated)  Over the past two weeks, have you felt down, depressed or hopeless? No Over the past two weeks, have you felt little interest or pleasure in doing things? No Have you lost interest or pleasure in daily life? No Do you often feel hopeless? No Do you cry easily over simple problems? No   Activities of Daily Living  In your present state of health, do you have any difficulty performing the following activities?:  Driving? No  Managing money? No  Feeding yourself? No  Getting from bed to chair? No  Climbing a flight of stairs? No  Preparing food and eating?: No  Bathing or showering? No  Getting dressed: No  Getting to the toilet? No  Using the toilet:No  Moving around from place to place: No  In the past year have you fallen or had a near fall?:No  Are you sexually active? No  Do you have more than one partner? No   Hearing Difficulties: No  Do you often ask people to speak up or repeat themselves? No  Do you experience ringing or noises in your ears? No Do you have difficulty understanding soft or whispered voices? No  Do you feel that you have a problem with memory? No Do you often misplace items? No  Do you feel safe at home? Yes  Cognitive Testing  Alert? Yes Normal Appearance?Yes  Oriented to person? Yes Place? Yes   Time? Yes  Recall of three objects? Yes  Can perform simple calculations? Yes  Displays appropriate judgment?Yes  Can read the correct time from a watch face?Yes   List the Names of Other Physician/Practitioners you currently use:    Indicate any recent Medical Services you may have received from other than [facility] providers in the past year (date may be approximate).   Screening Tests / Date Colonoscopy     UTD                Zostavax - Declines  Pneumonia- UTD Influenza Vaccine  UTD Tetanus/tdap UTD   ROS: GEN- denies fatigue, fever, weight loss,weakness, recent illness HEENT- denies eye drainage, change in vision, nasal discharge, CVS- denies chest pain, palpitations RESP- denies SOB, cough, wheeze ABD- denies N/V, change in stools, abd pain GU- denies dysuria, hematuria, dribbling, incontinence MSK- denies joint pain, muscle aches, injury Neuro- denies headache, dizziness, syncope, seizure activity  Physical: vitals reviewed  GEN- NAD, alert and oriented x3 HEENT- PERRL, EOMI, non injected sclera, pink conjunctiva, MMM, oropharynx clear Neck- Supple, no thryomegaly CVS- RRR, no murmur RESP-CTAB ABD-NABS,soft,nt,nd PSYCH- normal affect and mood  EXT- No edema Pulses- Radial, DP- 2+    Assessment:    Annual wellness medicare exam   Plan:    During the course of the visit the patient was educated and counseled about  appropriate screening and preventive services including:   FALL/ADUIT C/ Depression screening negative   HTN- well controlled no changes  Declines shingles vaccine   PSA screening done- discussed with patient  Fasting labs   Psych- continue ativan prn  He also continues to work full time   Tobacco cessation- he is not ready to quit, counseled   Given handout on advanced directives but he is a full code        Diet review for nutrition referral? Yes ____ Not Indicated __x__  Patient Instructions (the written plan) was given to the  patient.  Medicare Attestation  I have personally reviewed:  The patient's medical and social history  Their use of alcohol, tobacco or illicit drugs  Their current medications and supplements  The patient's functional ability including ADLs,fall risks, home safety risks, cognitive, and hearing and visual impairment  Diet and physical activities  Evidence for depression or mood disorders  The patient's weight, height, BMI, and visual acuity have been recorded in the chart. I have made referrals, counseling, and provided education to the patient based on review of the above and I have provided the patient with a written personalized care plan for preventive services.

## 2018-04-30 NOTE — Patient Instructions (Signed)
F/U 6 months

## 2018-05-01 ENCOUNTER — Encounter: Payer: Self-pay | Admitting: Family Medicine

## 2018-05-01 LAB — LIPID PANEL
Cholesterol: 219 mg/dL — ABNORMAL HIGH (ref <200)
HDL: 56 mg/dL (ref >40)
LDL Cholesterol (Calc): 140 mg/dL (calc) — ABNORMAL HIGH
Non-HDL Cholesterol (Calc): 163 mg/dL (calc) — ABNORMAL HIGH (ref <130)
TRIGLYCERIDES: 113 mg/dL (ref <150)
Total CHOL/HDL Ratio: 3.9 (calc) (ref <5.0)

## 2018-05-01 LAB — CBC WITH DIFFERENTIAL/PLATELET
ABSOLUTE MONOCYTES: 421 {cells}/uL (ref 200–950)
Basophils Absolute: 42 cells/uL (ref 0–200)
Basophils Relative: 0.8 %
EOS ABS: 369 {cells}/uL (ref 15–500)
Eosinophils Relative: 7.1 %
HCT: 42.1 % (ref 38.5–50.0)
Hemoglobin: 14.2 g/dL (ref 13.2–17.1)
Lymphs Abs: 1316 cells/uL (ref 850–3900)
MCH: 31.2 pg (ref 27.0–33.0)
MCHC: 33.7 g/dL (ref 32.0–36.0)
MCV: 92.5 fL (ref 80.0–100.0)
MONOS PCT: 8.1 %
MPV: 10.1 fL (ref 7.5–12.5)
NEUTROS PCT: 58.7 %
Neutro Abs: 3052 cells/uL (ref 1500–7800)
PLATELETS: 262 10*3/uL (ref 140–400)
RBC: 4.55 10*6/uL (ref 4.20–5.80)
RDW: 11.1 % (ref 11.0–15.0)
TOTAL LYMPHOCYTE: 25.3 %
WBC: 5.2 10*3/uL (ref 3.8–10.8)

## 2018-05-01 LAB — COMPREHENSIVE METABOLIC PANEL
AG RATIO: 1.4 (calc) (ref 1.0–2.5)
ALT: 15 U/L (ref 9–46)
AST: 16 U/L (ref 10–35)
Albumin: 4.2 g/dL (ref 3.6–5.1)
Alkaline phosphatase (APISO): 74 U/L (ref 40–115)
BUN/Creatinine Ratio: 9 (calc) (ref 6–22)
BUN: 12 mg/dL (ref 7–25)
CALCIUM: 9.9 mg/dL (ref 8.6–10.3)
CHLORIDE: 104 mmol/L (ref 98–110)
CO2: 29 mmol/L (ref 20–32)
Creat: 1.3 mg/dL — ABNORMAL HIGH (ref 0.70–1.18)
Globulin: 2.9 g/dL (calc) (ref 1.9–3.7)
Glucose, Bld: 91 mg/dL (ref 65–99)
Potassium: 4.4 mmol/L (ref 3.5–5.3)
Sodium: 142 mmol/L (ref 135–146)
Total Bilirubin: 0.5 mg/dL (ref 0.2–1.2)
Total Protein: 7.1 g/dL (ref 6.1–8.1)

## 2018-05-01 LAB — PSA: PSA: 1.5 ng/mL (ref <=4.0)

## 2018-05-01 MED ORDER — VALSARTAN-HYDROCHLOROTHIAZIDE 160-12.5 MG PO TABS: 1.0000 | ORAL_TABLET | Freq: Every day | ORAL | 2 refills | Status: DC

## 2018-05-01 MED ORDER — AMLODIPINE BESYLATE 10 MG PO TABS: 10.0000 mg | ORAL_TABLET | Freq: Every day | ORAL | 2 refills | Status: DC

## 2018-09-09 ENCOUNTER — Other Ambulatory Visit: Payer: Self-pay | Admitting: Family Medicine

## 2018-09-09 NOTE — Telephone Encounter (Signed)
Ok to refill??  Last office visit/ refill 04/30/2018.

## 2019-03-03 ENCOUNTER — Encounter: Payer: Self-pay | Admitting: Gastroenterology

## 2019-03-04 ENCOUNTER — Other Ambulatory Visit: Payer: Self-pay

## 2019-03-04 DIAGNOSIS — Z20822 Contact with and (suspected) exposure to covid-19: Secondary | ICD-10-CM

## 2019-03-06 LAB — NOVEL CORONAVIRUS, NAA: SARS-CoV-2, NAA: NOT DETECTED

## 2019-03-11 ENCOUNTER — Encounter: Payer: Self-pay | Admitting: Gastroenterology

## 2019-03-26 ENCOUNTER — Other Ambulatory Visit: Payer: Self-pay | Admitting: Family Medicine

## 2019-03-26 NOTE — Telephone Encounter (Signed)
Ok to refill??  Last office visit 04/30/2018.  Last refill 09/09/2018, #1 refills.

## 2019-04-10 ENCOUNTER — Encounter: Payer: Self-pay | Admitting: Gastroenterology

## 2019-04-14 ENCOUNTER — Ambulatory Visit: Payer: Medicare Other | Admitting: *Deleted

## 2019-04-28 ENCOUNTER — Encounter: Payer: Medicare Other | Admitting: Gastroenterology

## 2019-05-11 ENCOUNTER — Ambulatory Visit: Payer: Medicare Other | Attending: Internal Medicine

## 2019-05-11 ENCOUNTER — Other Ambulatory Visit: Payer: Self-pay

## 2019-05-11 DIAGNOSIS — Z23 Encounter for immunization: Secondary | ICD-10-CM | POA: Insufficient documentation

## 2019-05-11 NOTE — Progress Notes (Signed)
   Covid-19 Vaccination Clinic  Name:  Robert Nixon    MRN: [id number] DOB: 1945/10/10  05/11/2019  [name] was observed post Covid-19 immunization for 15 minutes without incidence. He was provided with Vaccine Information Sheet and instruction to access the V-Safe system.   Mr. Payment was instructed to call 911 with any severe reactions post vaccine: Marland Kitchen Difficulty breathing  . Swelling of your face and throat  . A fast heartbeat  . A bad rash all over your body  . Dizziness and weakness    Immunizations Administered    Name Date Dose VIS Date Route   Moderna COVID-19 Vaccine 05/11/2019  1:17 PM 0.5 mL 03/04/2019 Intramuscular   Manufacturer: Moderna   Lot: [id number]   [id number]

## 2019-05-15 ENCOUNTER — Ambulatory Visit (AMBULATORY_SURGERY_CENTER): Payer: Self-pay | Admitting: *Deleted

## 2019-05-15 ENCOUNTER — Other Ambulatory Visit: Payer: Self-pay

## 2019-05-15 ENCOUNTER — Other Ambulatory Visit: Payer: Self-pay | Admitting: Family Medicine

## 2019-05-15 VITALS — Temp 97.1°F | Ht 73.0 in | Wt 174.8 lb

## 2019-05-15 DIAGNOSIS — Z86010 Personal history of colonic polyps: Secondary | ICD-10-CM

## 2019-05-15 DIAGNOSIS — Z01818 Encounter for other preprocedural examination: Secondary | ICD-10-CM

## 2019-05-15 MED ORDER — SUPREP BOWEL PREP KIT 17.5-3.13-1.6 GM/177ML PO SOLN: 1.0000 | Freq: Once | ORAL | 0 refills | Status: AC

## 2019-05-15 NOTE — Progress Notes (Signed)
Patient denies any allergies to egg or soy products. Patient denies complications with anesthesia/sedation.  Patient denies oxygen use at home and denies diet medications. Pamphlet given on colonoscopy. 

## 2019-05-16 ENCOUNTER — Other Ambulatory Visit: Payer: Self-pay | Admitting: Family Medicine

## 2019-05-20 ENCOUNTER — Encounter: Payer: Self-pay | Admitting: Gastroenterology

## 2019-05-26 ENCOUNTER — Other Ambulatory Visit (HOSPITAL_COMMUNITY)
Admission: RE | Admit: 2019-05-26 | Discharge: 2019-05-26 | Disposition: A | Discharge status: Home / Self Care | Payer: Medicare Other | Source: Ambulatory Visit | Attending: Gastroenterology | Admitting: Gastroenterology

## 2019-05-26 ENCOUNTER — Other Ambulatory Visit: Payer: Self-pay

## 2019-05-29 ENCOUNTER — Encounter: Payer: Medicare Other | Admitting: Gastroenterology

## 2019-06-02 ENCOUNTER — Ambulatory Visit (INDEPENDENT_AMBULATORY_CARE_PROVIDER_SITE_OTHER): Payer: Medicare Other | Admitting: Family Medicine

## 2019-06-02 ENCOUNTER — Other Ambulatory Visit: Payer: Self-pay

## 2019-06-02 ENCOUNTER — Encounter: Payer: Self-pay | Admitting: Family Medicine

## 2019-06-02 VITALS — BP 158/82 | HR 82 | Temp 98.0°F | Resp 12 | Ht 73.0 in | Wt 171.0 lb

## 2019-06-02 DIAGNOSIS — Z7289 Other problems related to lifestyle: Secondary | ICD-10-CM | POA: Diagnosis not present

## 2019-06-02 DIAGNOSIS — K219 Gastro-esophageal reflux disease without esophagitis: Secondary | ICD-10-CM

## 2019-06-02 DIAGNOSIS — E7800 Pure hypercholesterolemia, unspecified: Secondary | ICD-10-CM

## 2019-06-02 DIAGNOSIS — I10 Essential (primary) hypertension: Secondary | ICD-10-CM | POA: Diagnosis not present

## 2019-06-02 DIAGNOSIS — F1090 Alcohol use, unspecified, uncomplicated: Secondary | ICD-10-CM

## 2019-06-02 DIAGNOSIS — Z789 Other specified health status: Secondary | ICD-10-CM

## 2019-06-02 DIAGNOSIS — J302 Other seasonal allergic rhinitis: Secondary | ICD-10-CM

## 2019-06-02 MED ORDER — VALSARTAN-HYDROCHLOROTHIAZIDE 160-12.5 MG PO TABS: 1.0000 | ORAL_TABLET | Freq: Every day | ORAL | 2 refills | Status: DC

## 2019-06-02 MED ORDER — OMEPRAZOLE 20 MG PO CPDR: 20.0000 mg | DELAYED_RELEASE_CAPSULE | Freq: Every day | ORAL | 6 refills | Status: DC | PRN

## 2019-06-02 MED ORDER — AMLODIPINE BESYLATE 10 MG PO TABS: ORAL_TABLET | ORAL | 2 refills | Status: DC

## 2019-06-02 MED ORDER — FLUTICASONE PROPIONATE 50 MCG/ACT NA SUSP: 2.0000 | Freq: Every day | NASAL | 6 refills | Status: DC

## 2019-06-02 NOTE — Patient Instructions (Addendum)
Try the omeprazole for any reflux symptoms  Try the flonase  We will call with lab results  F/u 3 months for Physical

## 2019-06-02 NOTE — Progress Notes (Signed)
Subjective:    Patient ID: Robert Nixon, male    DOB: 04/14/1945, 73 y.o.   MRN: [id number]  Patient presents for Follow-up (is not fasting) and Sinus Pressure (dizziness, ear pressure, HA )  Patient here to follow-up chronic medical problems.  His last visit was over a year ago.  He admits to me today that he has been increasing his alcohol over the past couple years.  Nothing particular was bothering him but he got used to drinking initially few times a week then it became every day.  He has been sober for the past 2 weeks.  He states that he would notice his blood pressure would go up when he would go back on alcohol when he did take a break for a few weeks.  He actually stopped taking his nighttime amlodipine because he read that it would interfere with alcohol as well but now he is back on it since he has been sober.  He was using the lorazepam at night to help him sleep and calm his nerves as he would often wake up in the [place] to get a drink but he has not been on this medication recently.  He denies any DTs.  He has had some mild GI upset with reflux symptoms.  In the past was on something for his hiatal hernia he states but he has not been on anything recently.  His bowels are moving normally urinating normally.  He has had some issues with sinuses  Draining, dizziness,  ear pressure headache on occasion.  Some sneezing.  He has been taking Zyrtec this has not helped.  He denies any current facial pain fever cough congestion.  Review Of Systems:  GEN- denies fatigue, fever, weight loss,weakness, recent illness HEENT- denies eye drainage, change in vision, +nasal discharge, CVS- denies chest pain, palpitations RESP- denies SOB, cough, wheeze ABD- denies N/V, change in stools, abd pain GU- denies dysuria, hematuria, dribbling, incontinence MSK- denies joint pain, muscle aches, injury Neuro- denies headache, dizziness, syncope, seizure activity       Objective:    BP (!)  158/82   Pulse 82   Temp 98 F (36.7 C) (Temporal)   Resp 12   Ht 6\' 1"  (1.854 m)   Wt 171 lb (77.6 kg)   SpO2 96%   BMI 22.56 kg/m  GEN- NAD, alert and oriented x3 HEENT- PERRL, EOMI, non injected sclera, pink conjunctiva, MMM, oropharynx clear, nares clear rhinorrhea, no sinus tenderness, TM clear no effusion  Neck- Supple, no thyromegaly CVS- RRR, no murmur RESP-CTAB ABD-NABS,soft,NT,ND Psych normal affect and mood EXT- No edema Pulses- Radial, DP- 2+        Assessment & Plan:      Problem List Items Addressed This Visit      Unprioritized   Alcohol use    He declines AAA or counseling at this time.  Feels he can do it on his own.  He does have support from his wife and his sister.      Allergic rhinitis    Add nasal steroid for the drainage       Essential hypertension - Primary    Bp continued to go down as he sat in office It is often elevated in the office BP at home normal Advised to take both meds Check labs in setting of his ETOH use      Relevant Medications   valsartan-hydrochlorothiazide (DIOVAN-HCT) 160-12.5 MG tablet   amLODipine (NORVASC) 10 MG tablet  Other Relevant Orders   CBC with Differential/Platelet (Completed)   Comprehensive metabolic panel (Completed)   TSH (Completed)   GERD (gastroesophageal reflux disease)    Given as needed omeprazole.  He may have some underlying gastritis from the alcohol      Relevant Medications   omeprazole (PRILOSEC) 20 MG capsule   HYPERCHOLESTEROLEMIA    Recheck lipids as well as liver function test in the setting of alcohol use.      Relevant Medications   valsartan-hydrochlorothiazide (DIOVAN-HCT) 160-12.5 MG tablet   amLODipine (NORVASC) 10 MG tablet   Other Relevant Orders   Lipid panel (Completed)      Note: This dictation was prepared with Dragon dictation along with smaller phrase technology. Any transcriptional errors that result from this process are unintentional.

## 2019-06-03 ENCOUNTER — Encounter: Payer: Self-pay | Admitting: Family Medicine

## 2019-06-03 DIAGNOSIS — Z789 Other specified health status: Secondary | ICD-10-CM | POA: Insufficient documentation

## 2019-06-03 DIAGNOSIS — F1090 Alcohol use, unspecified, uncomplicated: Secondary | ICD-10-CM | POA: Insufficient documentation

## 2019-06-03 DIAGNOSIS — Z7289 Other problems related to lifestyle: Secondary | ICD-10-CM | POA: Insufficient documentation

## 2019-06-03 DIAGNOSIS — J309 Allergic rhinitis, unspecified: Secondary | ICD-10-CM | POA: Insufficient documentation

## 2019-06-03 LAB — CBC WITH DIFFERENTIAL/PLATELET
Absolute Monocytes: 729 cells/uL (ref 200–950)
Basophils Absolute: 49 cells/uL (ref 0–200)
Basophils Relative: 0.9 %
Eosinophils Absolute: 151 cells/uL (ref 15–500)
Eosinophils Relative: 2.8 %
HCT: 35.3 % — ABNORMAL LOW (ref 38.5–50.0)
Hemoglobin: 12.1 g/dL — ABNORMAL LOW (ref 13.2–17.1)
Lymphs Abs: 1917 cells/uL (ref 850–3900)
MCH: 34.5 pg — ABNORMAL HIGH (ref 27.0–33.0)
MCHC: 34.3 g/dL (ref 32.0–36.0)
MCV: 100.6 fL — ABNORMAL HIGH (ref 80.0–100.0)
MPV: 11.1 fL (ref 7.5–12.5)
Monocytes Relative: 13.5 %
Neutro Abs: 2554 cells/uL (ref 1500–7800)
Neutrophils Relative %: 47.3 %
Platelets: 243 10*3/uL (ref 140–400)
RBC: 3.51 10*6/uL — ABNORMAL LOW (ref 4.20–5.80)
RDW: 12.9 % (ref 11.0–15.0)
Total Lymphocyte: 35.5 %
WBC: 5.4 10*3/uL (ref 3.8–10.8)

## 2019-06-03 LAB — COMPREHENSIVE METABOLIC PANEL
AG Ratio: 1.6 (calc) (ref 1.0–2.5)
ALT: 181 U/L — ABNORMAL HIGH (ref 9–46)
AST: 84 U/L — ABNORMAL HIGH (ref 10–35)
Albumin: 4.2 g/dL (ref 3.6–5.1)
Alkaline phosphatase (APISO): 87 U/L (ref 35–144)
BUN/Creatinine Ratio: 18 (calc) (ref 6–22)
BUN: 23 mg/dL (ref 7–25)
CO2: 27 mmol/L (ref 20–32)
Calcium: 10.4 mg/dL — ABNORMAL HIGH (ref 8.6–10.3)
Chloride: 103 mmol/L (ref 98–110)
Creat: 1.26 mg/dL — ABNORMAL HIGH (ref 0.70–1.18)
Globulin: 2.7 g/dL (calc) (ref 1.9–3.7)
Glucose, Bld: 120 mg/dL — ABNORMAL HIGH (ref 65–99)
Potassium: 4.7 mmol/L (ref 3.5–5.3)
Sodium: 138 mmol/L (ref 135–146)
Total Bilirubin: 0.6 mg/dL (ref 0.2–1.2)
Total Protein: 6.9 g/dL (ref 6.1–8.1)

## 2019-06-03 LAB — LIPID PANEL
Cholesterol: 209 mg/dL — ABNORMAL HIGH (ref <200)
HDL: 74 mg/dL (ref >=40)
LDL Cholesterol (Calc): 116 mg/dL (calc) — ABNORMAL HIGH
Non-HDL Cholesterol (Calc): 135 mg/dL (calc) — ABNORMAL HIGH (ref <130)
Total CHOL/HDL Ratio: 2.8 (calc) (ref <5.0)
Triglycerides: 90 mg/dL (ref <150)

## 2019-06-03 LAB — TSH: TSH: 1.56 mIU/L (ref 0.40–4.50)

## 2019-06-03 NOTE — Assessment & Plan Note (Signed)
Bp continued to go down as he sat in office It is often elevated in the office BP at home normal Advised to take both meds Check labs in setting of his ETOH use

## 2019-06-03 NOTE — Assessment & Plan Note (Signed)
He declines AAA or counseling at this time.  Feels he can do it on his own.  He does have support from his wife and his sister.

## 2019-06-03 NOTE — Assessment & Plan Note (Signed)
Given as needed omeprazole.  He may have some underlying gastritis from the alcohol

## 2019-06-03 NOTE — Assessment & Plan Note (Signed)
Recheck lipids as well as liver function test in the setting of alcohol use.

## 2019-06-03 NOTE — Assessment & Plan Note (Signed)
Add nasal steroid for the drainage

## 2019-06-06 ENCOUNTER — Other Ambulatory Visit: Payer: Self-pay | Admitting: *Deleted

## 2019-06-06 DIAGNOSIS — R7989 Other specified abnormal findings of blood chemistry: Secondary | ICD-10-CM

## 2019-06-06 DIAGNOSIS — F1010 Alcohol abuse, uncomplicated: Secondary | ICD-10-CM

## 2019-06-11 ENCOUNTER — Ambulatory Visit: Payer: Medicare Other | Attending: Internal Medicine

## 2019-06-11 DIAGNOSIS — Z23 Encounter for immunization: Secondary | ICD-10-CM

## 2019-06-11 NOTE — Progress Notes (Signed)
   Covid-19 Vaccination Clinic  Name:  Robert Nixon    MRN: [id number] DOB: 01-18-1946  06/11/2019  [name] was observed post Covid-19 immunization for 15 minutes without incident. He was provided with Vaccine Information Sheet and instruction to access the V-Safe system.   [name] was instructed to call 911 with any severe reactions post vaccine: Marland Kitchen Difficulty breathing  . Swelling of face and throat  . A fast heartbeat  . A bad rash all over body  . Dizziness and weakness   Immunizations Administered    Name Date Dose VIS Date Route   Moderna COVID-19 Vaccine 06/11/2019  1:51 PM 0.5 mL 03/04/2019 Intramuscular   Manufacturer: Moderna   Lot: [id number]   CataractPO:[id number]

## 2019-06-12 ENCOUNTER — Other Ambulatory Visit: Payer: Self-pay

## 2019-06-12 ENCOUNTER — Ambulatory Visit (HOSPITAL_COMMUNITY)
Admission: RE | Admit: 2019-06-12 | Discharge: 2019-06-12 | Disposition: A | Discharge status: Home / Self Care | Payer: Medicare Other | Source: Ambulatory Visit | Attending: Family Medicine | Admitting: Family Medicine

## 2019-06-12 DIAGNOSIS — F1010 Alcohol abuse, uncomplicated: Secondary | ICD-10-CM | POA: Diagnosis present

## 2019-06-12 DIAGNOSIS — K7689 Other specified diseases of liver: Secondary | ICD-10-CM | POA: Diagnosis not present

## 2019-06-12 DIAGNOSIS — R7989 Other specified abnormal findings of blood chemistry: Secondary | ICD-10-CM

## 2019-06-13 ENCOUNTER — Other Ambulatory Visit: Payer: Self-pay | Admitting: *Deleted

## 2019-06-13 ENCOUNTER — Encounter: Payer: Self-pay | Admitting: Gastroenterology

## 2019-06-13 DIAGNOSIS — K769 Liver disease, unspecified: Secondary | ICD-10-CM

## 2019-06-13 DIAGNOSIS — R7989 Other specified abnormal findings of blood chemistry: Secondary | ICD-10-CM

## 2019-06-17 ENCOUNTER — Other Ambulatory Visit: Payer: Self-pay

## 2019-06-17 ENCOUNTER — Other Ambulatory Visit: Payer: Medicare Other

## 2019-06-17 DIAGNOSIS — R7989 Other specified abnormal findings of blood chemistry: Secondary | ICD-10-CM | POA: Diagnosis not present

## 2019-06-17 DIAGNOSIS — K769 Liver disease, unspecified: Secondary | ICD-10-CM | POA: Diagnosis not present

## 2019-06-18 LAB — COMPLETE METABOLIC PANEL WITH GFR
AG Ratio: 1.4 (calc) (ref 1.0–2.5)
ALT: 32 U/L (ref 9–46)
AST: 24 U/L (ref 10–35)
Albumin: 3.9 g/dL (ref 3.6–5.1)
Alkaline phosphatase (APISO): 83 U/L (ref 35–144)
BUN: 11 mg/dL (ref 7–25)
CO2: 27 mmol/L (ref 20–32)
Calcium: 9.6 mg/dL (ref 8.6–10.3)
Chloride: 103 mmol/L (ref 98–110)
Creat: 1.17 mg/dL (ref 0.70–1.18)
GFR, Est African American: 72 mL/min/{1.73_m2} (ref >=60)
GFR, Est Non African American: 62 mL/min/{1.73_m2} (ref >=60)
Globulin: 2.7 g/dL (calc) (ref 1.9–3.7)
Glucose, Bld: 88 mg/dL (ref 65–99)
Potassium: 4.5 mmol/L (ref 3.5–5.3)
Sodium: 140 mmol/L (ref 135–146)
Total Bilirubin: 0.5 mg/dL (ref 0.2–1.2)
Total Protein: 6.6 g/dL (ref 6.1–8.1)

## 2019-06-18 LAB — HEPATITIS PANEL, ACUTE
Hep A IgM: NONREACTIVE
Hep B C IgM: NONREACTIVE
Hepatitis B Surface Ag: NONREACTIVE
Hepatitis C Ab: NONREACTIVE
SIGNAL TO CUT-OFF: 0.03 (ref <1.00)

## 2019-07-10 ENCOUNTER — Other Ambulatory Visit: Payer: Self-pay

## 2019-07-10 ENCOUNTER — Ambulatory Visit (AMBULATORY_SURGERY_CENTER): Payer: Self-pay | Admitting: *Deleted

## 2019-07-10 VITALS — Temp 97.8°F | Ht 73.0 in | Wt 175.6 lb

## 2019-07-10 DIAGNOSIS — Z86010 Personal history of colonic polyps: Secondary | ICD-10-CM

## 2019-07-10 MED ORDER — SUPREP BOWEL PREP KIT 17.5-3.13-1.6 GM/177ML PO SOLN: 1.0000 | Freq: Once | ORAL | 0 refills | Status: AC

## 2019-07-10 NOTE — Progress Notes (Signed)
Patient denies any allergies to egg or soy products. Patient denies complications with anesthesia/sedation.  Patient denies oxygen use at home and denies diet medications. Emmi instructions for colonoscopy explained and given to patient.  

## 2019-07-23 ENCOUNTER — Encounter: Payer: Self-pay | Admitting: Gastroenterology

## 2019-07-24 ENCOUNTER — Ambulatory Visit (AMBULATORY_SURGERY_CENTER): Payer: Medicare Other | Admitting: Gastroenterology

## 2019-07-24 ENCOUNTER — Other Ambulatory Visit: Payer: Self-pay

## 2019-07-24 ENCOUNTER — Encounter: Payer: Self-pay | Admitting: Gastroenterology

## 2019-07-24 VITALS — BP 120/73 | HR 72 | Temp 95.9°F | Resp 18 | Ht 73.0 in | Wt 175.6 lb

## 2019-07-24 DIAGNOSIS — D122 Benign neoplasm of ascending colon: Secondary | ICD-10-CM

## 2019-07-24 DIAGNOSIS — D124 Benign neoplasm of descending colon: Secondary | ICD-10-CM | POA: Diagnosis not present

## 2019-07-24 DIAGNOSIS — D125 Benign neoplasm of sigmoid colon: Secondary | ICD-10-CM

## 2019-07-24 DIAGNOSIS — Z86010 Personal history of colonic polyps: Secondary | ICD-10-CM | POA: Diagnosis not present

## 2019-07-24 DIAGNOSIS — D123 Benign neoplasm of transverse colon: Secondary | ICD-10-CM | POA: Diagnosis not present

## 2019-07-24 MED ORDER — SODIUM CHLORIDE 0.9 % IV SOLN: 500.0000 mL | Freq: Once | INTRAVENOUS | Status: DC

## 2019-07-24 NOTE — Progress Notes (Signed)
pt tolerated well. VSS. awake and to recovery. Report given to RN.  

## 2019-07-24 NOTE — Progress Notes (Signed)
Pt's states no medical or surgical changes since previsit or office visit. 

## 2019-07-24 NOTE — Op Note (Signed)
[place] Patient Name: Robert Nixon Procedure Date: 07/24/2019 10:05 AM MRN: [id number] Endoscopist: Ladene Artist , MD Age: 74 Referring MD:  Date of Birth: 04-14-1945 Gender: Male Account #: [id number] Procedure:                Colonoscopy Indications:              Surveillance: Personal history of adenomatous                            polyps on last colonoscopy > 5 years ago Medicines:                Monitored Anesthesia Care Procedure:                Pre-Anesthesia Assessment:                           - Prior to the procedure, a History and Physical                            was performed, and patient medications and                            allergies were reviewed. The patient's tolerance of                            previous anesthesia was also reviewed. The risks                            and benefits of the procedure and the sedation                            options and risks were discussed with the patient.                            All questions were answered, and informed consent                            was obtained. Prior Anticoagulants: The patient has                            taken no previous anticoagulant or antiplatelet                            agents. ASA Grade Assessment: II - A patient with                            mild systemic disease. After reviewing the risks                            and benefits, the patient was deemed in                            satisfactory condition to undergo the procedure.  After obtaining informed consent, the colonoscope                            was passed under direct vision. Throughout the                            procedure, the patient's blood pressure, pulse, and                            oxygen saturations were monitored continuously. The                            Colonoscope was introduced through the anus and                            advanced to the the  cecum, identified by                            appendiceal orifice and ileocecal valve. The                            ileocecal valve, appendiceal orifice, and rectum                            were photographed. The quality of the bowel                            preparation was good. The colonoscopy was performed                            without difficulty. The patient tolerated the                            procedure well. Scope In: 10:11:35 AM Scope Out: 10:29:33 AM Scope Withdrawal Time: 0 hours 13 minutes 15 seconds  Total Procedure Duration: 0 hours 17 minutes 58 seconds  Findings:                 The perianal and digital rectal examinations were                            normal.                           Seven sessile polyps were found in the sigmoid                            colon (2), descending colon (2), transverse colon                            (1) and ascending colon (2). The polyps were 6 to 8                            mm in size. These polyps were removed with a cold  snare. Resection and retrieval were complete.                           Scattered small-mouthed diverticula were found in                            the right colon. There was no evidence of                            diverticular bleeding.                           Multiple small-mouthed diverticula were found in                            the left colon. There was narrowing of the colon in                            association with the diverticular opening. There                            was evidence of diverticular spasm. There was no                            evidence of diverticular bleeding.                           Internal hemorrhoids were found during                            retroflexion. The hemorrhoids were small and Grade                            I (internal hemorrhoids that do not prolapse).                           The exam was otherwise without  abnormality on                            direct and retroflexion views. Complications:            No immediate complications. Estimated blood loss:                            None. Estimated Blood Loss:     Estimated blood loss: none. Impression:               - Seven 6 to 8 mm polyps in the sigmoid colon, in                            the descending colon, in the transverse colon and                            in the ascending colon, removed with a cold snare.  Resected and retrieved.                           - Mild diverticulosis in the right colon.                           - Moderate diverticulosis in the left colon.                           - Internal hemorrhoids.                           - The examination was otherwise normal on direct                            and retroflexion views. Recommendation:           - Repeat colonoscopy after studies are complete for                            surveillance based on pathology results.                           - Patient has a contact number available for                            emergencies. The signs and symptoms of potential                            delayed complications were discussed with the                            patient. Return to normal activities tomorrow.                            Written discharge instructions were provided to the                            patient.                           - High fiber diet.                           - Continue present medications.                           - Await pathology results. Ladene Artist, MD 07/24/2019 10:38:34 AM This report has been signed electronically.

## 2019-07-24 NOTE — Patient Instructions (Signed)
HANDOUTS PROVIDED ON: POLYPS, DIVERTICULOSIS, HEMORRHOIDS, & HIGH FIBER DIET  The polyps removed today have been sent for pathology.  The results can take 1-3 weeks to receive.  When your next colonoscopy should occur will be based on the pathology results.    You may resume your previous diet and medication schedule.  Thank you for allowing [place] to care for you today!!!   YOU HAD AN ENDOSCOPIC PROCEDURE TODAY AT [place]:   Refer to the procedure report that was given to you for any specific questions about what was found during the examination.  If the procedure report does not answer your questions, please call your gastroenterologist to clarify.  If you requested that your care partner not be given the details of your procedure findings, then the procedure report has been included in a sealed envelope for you to review at your convenience later.  YOU SHOULD EXPECT: Some feelings of bloating in the abdomen. Passage of more gas than usual.  Walking can help get rid of the air that was put into your GI tract during the procedure and reduce the bloating. If you had a lower endoscopy (such as a colonoscopy or flexible sigmoidoscopy) you may notice spotting of blood in your stool or on the toilet paper. If you underwent a bowel prep for your procedure, you may not have a normal bowel movement for a few days.  Please Note:  You might notice some irritation and congestion in your nose or some drainage.  This is from the oxygen used during your procedure.  There is no need for concern and it should clear up in a day or so.  SYMPTOMS TO REPORT IMMEDIATELY:   Following lower endoscopy (colonoscopy or flexible sigmoidoscopy):  Excessive amounts of blood in the stool  Significant tenderness or worsening of abdominal pains  Swelling of the abdomen that is new, acute  Fever of 100F or higher  For urgent or emergent issues, a gastroenterologist can be reached at any hour by calling  [phone]. Do not use MyChart messaging for urgent concerns.    DIET:  We do recommend a small meal at first, but then you may proceed to your regular diet.  Drink plenty of fluids but you should avoid alcoholic beverages for 24 hours.  ACTIVITY:  You should plan to take it easy for the rest of today and you should NOT DRIVE or use heavy machinery until tomorrow (because of the sedation medicines used during the test).    FOLLOW UP: Our staff will call the number listed on your records 48-72 hours following your procedure to check on you and address any questions or concerns that you may have regarding the information given to you following your procedure. If we do not reach you, we will leave a message.  We will attempt to reach you two times.  During this call, we will ask if you have developed any symptoms of COVID 19. If you develop any symptoms (ie: fever, flu-like symptoms, shortness of breath, cough etc.) before then, please call [phone].  If you test positive for Covid 19 in the 2 weeks post procedure, please call and report this information to [place].    If any biopsies were taken you will be contacted by phone or by letter within the next 1-3 weeks.  Please call us at [phone] if you have not heard about the biopsies in 3 weeks.    SIGNATURES/CONFIDENTIALITY: You and/or your care partner have signed  paperwork which will be entered into your electronic medical record.  These signatures attest to the fact that that the information above on your After Visit Summary has been reviewed and is understood.  Full responsibility of the confidentiality of this discharge information lies with you and/or your care-partner.

## 2019-07-28 ENCOUNTER — Telehealth: Payer: Self-pay

## 2019-07-28 NOTE — Telephone Encounter (Signed)
  Follow up Call-  Call back number 07/24/2019  Post procedure Call Back phone  # [phone]  Permission to leave phone message Yes  Some recent data might be hidden     Patient questions:  Do you have a fever, pain , or abdominal swelling? No. Pain Score  0 *  Have you tolerated food without any problems? Yes.    Have you been able to return to your normal activities? Yes.    Do you have any questions about your discharge instructions: Diet   No. Medications  No. Follow up visit  No.  Do you have questions or concerns about your Care? No.  Actions: * If pain score is 4 or above: No action needed, pain <4. 1. Have you developed a fever since your procedure? no  2.   Have you had an respiratory symptoms (SOB or cough) since your procedure? no  3.   Have you tested positive for COVID 19 since your procedure no  4.   Have you had any family members/close contacts diagnosed with the COVID 19 since your procedure?  no   If yes to any of these questions please route to Joylene John, RN and Erenest Rasher, RN

## 2019-08-06 ENCOUNTER — Encounter: Payer: Self-pay | Admitting: Gastroenterology

## 2019-08-22 ENCOUNTER — Encounter: Payer: Self-pay | Admitting: Family Medicine

## 2019-08-22 ENCOUNTER — Ambulatory Visit (INDEPENDENT_AMBULATORY_CARE_PROVIDER_SITE_OTHER): Payer: Medicare Other | Admitting: Family Medicine

## 2019-08-22 ENCOUNTER — Other Ambulatory Visit: Payer: Self-pay

## 2019-08-22 VITALS — BP 120/66 | HR 96 | Temp 98.4°F | Resp 18 | Wt 176.2 lb

## 2019-08-22 DIAGNOSIS — M10072 Idiopathic gout, left ankle and foot: Secondary | ICD-10-CM

## 2019-08-22 MED ORDER — TRAMADOL HCL 50 MG PO TABS: 50.0000 mg | ORAL_TABLET | Freq: Three times a day (TID) | ORAL | 0 refills | Status: AC | PRN

## 2019-08-22 MED ORDER — COLCHICINE 0.6 MG PO TABS: ORAL_TABLET | ORAL | 0 refills | Status: DC

## 2019-08-22 NOTE — Progress Notes (Signed)
   Subjective:    Patient ID: Robert Nixon, male    DOB: 1946/01/31, 74 y.o.   MRN: [id number]  Patient presents for Gout (L big toe, started x2 days, tylenol was taken with no relief)  Patient here with pain to his left great toe that started 2 days ago.  States it was aching and throbbing for a few hours and started to swell.  Last night for severe that she could not touch it.  And to light as well as some redness.  He has not had any history past with his toes before.  He is taking Tylenol but that has not helped the pain.  He does not have any other joint swelling.  No nausea vomiting no fever no chills.  No injury to his lip.  Remain alcohol free.  He denies any red meat or shellfish Review Of Systems:  GEN- denies fatigue, fever, weight loss,weakness, recent illness HEENT- denies eye drainage, change in vision, nasal discharge, CVS- denies chest pain, palpitations RESP- denies SOB, cough, wheeze ABD- denies N/V, change in stools, abd pain GU- denies dysuria, hematuria, dribbling, incontinence MSK- +joint pain, muscle aches, injury Neuro- denies headache, dizziness, syncope, seizure activity       Objective:    BP 120/66 (BP Location: Right Arm, Patient Position: Sitting, Cuff Size: Normal)   Pulse 96   Temp 98.4 F (36.9 C) (Temporal)   Resp 18   Wt 176 lb 3.2 oz (79.9 kg)   SpO2 97%   BMI 23.25 kg/m  GEN- NAD, alert and oriented x3 CVS- RRR, no murmur RESP-CTAB EXT-left great toe.  Tenderness: Tenderness to palpation at the base Pulses- Radial, DP- 2+        Assessment & Plan:      Problem List Items Addressed This Visit    None    Visit Diagnoses    Acute idiopathic gout involving toe of left foot    -  Primary   Acute gout, check uric acid, discussed diet, start colchicine will taper down over 2 weeks   Relevant Medications   colchicine 0.6 MG tablet   traMADol (ULTRAM) 50 MG tablet   Other Relevant Orders   Uric Acid   Basic metabolic panel       Note: This dictation was prepared with Dragon dictation along with smaller phrase technology. Any transcriptional errors that result from this process are unintentional.

## 2019-08-22 NOTE — Patient Instructions (Signed)
Take 2 tablet of colchicine when you first get the medicine Then take 1 tablet twice a day for 1 week Then 1 tablet daily  We will call with results Tramadol for pain  F/U as previous

## 2019-08-23 LAB — BASIC METABOLIC PANEL
BUN/Creatinine Ratio: 10 (calc) (ref 6–22)
BUN: 13 mg/dL (ref 7–25)
CO2: 30 mmol/L (ref 20–32)
Calcium: 9.9 mg/dL (ref 8.6–10.3)
Chloride: 103 mmol/L (ref 98–110)
Creat: 1.27 mg/dL — ABNORMAL HIGH (ref 0.70–1.18)
Glucose, Bld: 99 mg/dL (ref 65–99)
Potassium: 3.9 mmol/L (ref 3.5–5.3)
Sodium: 140 mmol/L (ref 135–146)

## 2019-08-23 LAB — URIC ACID: Uric Acid, Serum: 8.3 mg/dL — ABNORMAL HIGH (ref 4.0–8.0)

## 2019-09-05 ENCOUNTER — Encounter: Payer: Self-pay | Admitting: Family Medicine

## 2019-09-05 ENCOUNTER — Ambulatory Visit (INDEPENDENT_AMBULATORY_CARE_PROVIDER_SITE_OTHER): Payer: Medicare Other | Admitting: Family Medicine

## 2019-09-05 ENCOUNTER — Other Ambulatory Visit: Payer: Self-pay

## 2019-09-05 VITALS — BP 138/78 | HR 82 | Temp 98.0°F | Resp 14 | Ht 73.0 in | Wt 180.0 lb

## 2019-09-05 DIAGNOSIS — Z0000 Encounter for general adult medical examination without abnormal findings: Secondary | ICD-10-CM

## 2019-09-05 DIAGNOSIS — I10 Essential (primary) hypertension: Secondary | ICD-10-CM | POA: Diagnosis not present

## 2019-09-05 DIAGNOSIS — Z0001 Encounter for general adult medical examination with abnormal findings: Secondary | ICD-10-CM

## 2019-09-05 DIAGNOSIS — Z125 Encounter for screening for malignant neoplasm of prostate: Secondary | ICD-10-CM | POA: Diagnosis not present

## 2019-09-05 DIAGNOSIS — H9193 Unspecified hearing loss, bilateral: Secondary | ICD-10-CM

## 2019-09-05 DIAGNOSIS — F17200 Nicotine dependence, unspecified, uncomplicated: Secondary | ICD-10-CM

## 2019-09-05 DIAGNOSIS — Z789 Other specified health status: Secondary | ICD-10-CM

## 2019-09-05 LAB — PSA: PSA: 2.5 ng/mL (ref <=4.0)

## 2019-09-05 MED ORDER — SHINGRIX 50 MCG/0.5ML IM SUSR: 0.5000 mL | Freq: Once | INTRAMUSCULAR | 1 refills | Status: AC

## 2019-09-05 NOTE — Patient Instructions (Addendum)
F/U 6 months- Fasting  Shingles vaccine sent to pharmacy  We will call with prostate level Referral to audiology

## 2019-09-05 NOTE — Assessment & Plan Note (Signed)
Controlled no changes 

## 2019-09-05 NOTE — Progress Notes (Signed)
Subjective:   Patient presents for Medicare Annual/Subsequent preventive examination.   Pt here for CPE  No concerns  Recent gout flare has resolved  Meds reviewed    Review Past Medical/Family/Social: per EMR    Risk Factors  Current exercise habits: stays active  Dietary issues discussed: Yes  Cardiac risk factors: HTN, previous heavy ETOH use, smoker, hyperlipidemia   Depression Screen  (Note: if answer to either of the following is "Yes", a more complete depression screening is indicated)  Over the past two weeks, have you felt down, depressed or hopeless? No Over the past two weeks, have you felt little interest or pleasure in doing things? No Have you lost interest or pleasure in daily life? No Do you often feel hopeless? No Do you cry easily over simple problems? No   Activities of Daily Living  In your present state of health, do you have any difficulty performing the following activities?:  Driving? No  Managing money? No  Feeding yourself? No  Getting from bed to chair? No  Climbing a flight of stairs? No  Preparing food and eating?: No  Bathing or showering? No  Getting dressed: No  Getting to the toilet? No  Using the toilet:No  Moving around from place to place: No  In the past year have you fallen or had a near fall?:No  Are you sexually active? No  Do you have more than one partner? No   Hearing Difficulties: Yes  , has family history of hearing problems  Do you often ask people to speak up or repeat themselves? Yes  Do you experience ringing or noises in your ears? someotnes Do you have difficulty understanding soft or whispered voices? Yes  Do you feel that you have a problem with memory? No Do you often misplace items? No  Do you feel safe at home? Yes  Cognitive Testing  Alert? Yes Normal Appearance?Yes  Oriented to person? Yes Place? Yes  Time? Yes  Recall of three objects? Yes  Can perform simple calculations? Yes  Displays appropriate  judgment?Yes  Can read the correct time from a watch face?Yes   List the Names of Other Physician/Practitioners you currently use:  GI - Dr. Fuller Plan   Screening Tests / Date Colonoscopy UTD  Repeat in 5 years                   Zostavax  Due  PNA UTD  Influenza Vaccine  UTD COVID-19- UTD Tetanus/tdap UTD  ROS:  GEN- denies fatigue, fever, weight loss,weakness, recent illness HEENT- denies eye drainage, change in vision, nasal discharge, CVS- denies chest pain, palpitations RESP- denies SOB, cough, wheeze ABD- denies N/V, change in stools, abd pain GU- denies dysuria, hematuria, dribbling, incontinence MSK- denies joint pain, muscle aches, injury Neuro- denies headache, dizziness, syncope, seizure activity  PHYSICAL GEN- NAD, alert and oriented x3 HEENT- PERRL, EOMI, non injected sclera, pink conjunctiva, MMM, oropharynx clear Neck- Supple, no thryomegaly CVS- RRR, no murmur RESP-CTAB ABD-NABS,soft,NT,ND  EXT- No edema Pulses- Radial, DP- 2+    Assessment:    Annual wellness medicare exam   Plan:    During the course of the visit the patient was educated and counseled about appropriate screening and preventive services including:   CPE done Shingles vaccine sent to pharmacy   FALL/DEPRESSION/CAGE screen neg   Failed hearing screen- refer to audiology   Discussed prostate cancer screening- PSA done  HTN controlled    Discussed living will FULL CODE- Handout  given    Recent fasting labs,will repeat in 4 months      Diet review for nutrition referral? Yes ____ Not Indicated __x__  Patient Instructions (the written plan) was given to the patient.  Medicare Attestation  I have personally reviewed:  The patient's medical and social history  Their use of alcohol, tobacco or illicit drugs  Their current medications and supplements  The patient's functional ability including ADLs,fall risks, home safety risks, cognitive, and hearing and visual impairment  Diet  and physical activities  Evidence for depression or mood disorders  The patient's weight, height, BMI, and visual acuity have been recorded in the chart. I have made referrals, counseling, and provided education to the patient based on review of the above and I have provided the patient with a written personalized care plan for preventive services.

## 2019-09-09 ENCOUNTER — Encounter: Payer: Self-pay | Admitting: *Deleted

## 2019-09-17 ENCOUNTER — Ambulatory Visit: Payer: Medicare Other | Admitting: Audiologist

## 2019-09-18 ENCOUNTER — Other Ambulatory Visit: Payer: Self-pay | Admitting: *Deleted

## 2019-09-18 NOTE — Telephone Encounter (Signed)
Received fax requesting refill on Colcrys for gout flare.   Ok to refill?

## 2019-09-19 MED ORDER — COLCHICINE 0.6 MG PO TABS: ORAL_TABLET | ORAL | 0 refills | Status: DC

## 2020-02-04 ENCOUNTER — Other Ambulatory Visit: Payer: Self-pay

## 2020-02-04 ENCOUNTER — Ambulatory Visit: Payer: Medicare Other

## 2020-02-04 ENCOUNTER — Ambulatory Visit (INDEPENDENT_AMBULATORY_CARE_PROVIDER_SITE_OTHER): Payer: Medicare Other | Admitting: *Deleted

## 2020-02-04 DIAGNOSIS — Z23 Encounter for immunization: Secondary | ICD-10-CM | POA: Diagnosis not present

## 2020-03-16 DIAGNOSIS — M19012 Primary osteoarthritis, left shoulder: Secondary | ICD-10-CM | POA: Diagnosis not present

## 2020-03-16 DIAGNOSIS — M7542 Impingement syndrome of left shoulder: Secondary | ICD-10-CM | POA: Diagnosis not present

## 2020-03-16 DIAGNOSIS — M25512 Pain in left shoulder: Secondary | ICD-10-CM | POA: Insufficient documentation

## 2020-04-13 DIAGNOSIS — M7542 Impingement syndrome of left shoulder: Secondary | ICD-10-CM | POA: Diagnosis not present

## 2020-04-13 DIAGNOSIS — M19012 Primary osteoarthritis, left shoulder: Secondary | ICD-10-CM | POA: Diagnosis not present

## 2020-04-15 ENCOUNTER — Other Ambulatory Visit (HOSPITAL_COMMUNITY): Payer: Self-pay | Admitting: Orthopedic Surgery

## 2020-04-15 ENCOUNTER — Other Ambulatory Visit: Payer: Self-pay | Admitting: Orthopedic Surgery

## 2020-04-15 DIAGNOSIS — M25512 Pain in left shoulder: Secondary | ICD-10-CM

## 2020-04-29 ENCOUNTER — Ambulatory Visit (HOSPITAL_COMMUNITY): Payer: Medicare Other

## 2020-05-13 ENCOUNTER — Other Ambulatory Visit: Payer: Self-pay

## 2020-05-13 ENCOUNTER — Ambulatory Visit (HOSPITAL_COMMUNITY)
Admission: RE | Admit: 2020-05-13 | Discharge: 2020-05-13 | Disposition: A | Discharge status: Home / Self Care | Payer: Medicare Other | Source: Ambulatory Visit | Attending: Orthopedic Surgery | Admitting: Orthopedic Surgery

## 2020-05-13 DIAGNOSIS — M25512 Pain in left shoulder: Secondary | ICD-10-CM | POA: Insufficient documentation

## 2020-05-13 DIAGNOSIS — M19012 Primary osteoarthritis, left shoulder: Secondary | ICD-10-CM | POA: Diagnosis not present

## 2020-05-17 DIAGNOSIS — M7542 Impingement syndrome of left shoulder: Secondary | ICD-10-CM | POA: Diagnosis not present

## 2020-05-18 ENCOUNTER — Other Ambulatory Visit: Payer: Self-pay | Admitting: Family Medicine

## 2020-07-19 ENCOUNTER — Ambulatory Visit: Payer: Medicare Other | Admitting: Internal Medicine

## 2020-07-22 ENCOUNTER — Ambulatory Visit (INDEPENDENT_AMBULATORY_CARE_PROVIDER_SITE_OTHER): Payer: Medicare Other | Admitting: Internal Medicine

## 2020-07-22 ENCOUNTER — Other Ambulatory Visit: Payer: Self-pay

## 2020-07-22 ENCOUNTER — Encounter: Payer: Self-pay | Admitting: Internal Medicine

## 2020-07-22 VITALS — BP 150/90 | HR 94 | Resp 18 | Ht 73.0 in | Wt 182.4 lb

## 2020-07-22 DIAGNOSIS — I10 Essential (primary) hypertension: Secondary | ICD-10-CM

## 2020-07-22 DIAGNOSIS — E785 Hyperlipidemia, unspecified: Secondary | ICD-10-CM

## 2020-07-22 DIAGNOSIS — Z7689 Persons encountering health services in other specified circumstances: Secondary | ICD-10-CM | POA: Diagnosis not present

## 2020-07-22 DIAGNOSIS — Z720 Tobacco use: Secondary | ICD-10-CM

## 2020-07-22 DIAGNOSIS — F411 Generalized anxiety disorder: Secondary | ICD-10-CM

## 2020-07-22 DIAGNOSIS — Z125 Encounter for screening for malignant neoplasm of prostate: Secondary | ICD-10-CM

## 2020-07-22 MED ORDER — LORAZEPAM 0.5 MG PO TABS: 0.5000 mg | ORAL_TABLET | Freq: Every day | ORAL | 0 refills | Status: DC

## 2020-07-22 MED ORDER — AMLODIPINE BESYLATE 10 MG PO TABS
ORAL_TABLET | ORAL | 2 refills | Status: DC
Start: 2020-07-22 — End: 2021-04-26

## 2020-07-22 NOTE — Assessment & Plan Note (Signed)
Smokes about 1 pack/day  Asked about quitting: confirms that he currently smokes cigarettes Advise to quit smoking: Educated about QUITTING to reduce the risk of cancer, cardio and cerebrovascular disease. Assess willingness: Unwilling to quit at this time, but is working on cutting back. Assist with counseling and pharmacotherapy: Counseled for 5 minutes and literature provided. Arrange for follow up: follow up in 3 months and continue to offer help.  

## 2020-07-22 NOTE — Assessment & Plan Note (Signed)
Check lipid profile Follow DASH diet

## 2020-07-22 NOTE — Assessment & Plan Note (Signed)
BP Readings from Last 1 Encounters:  07/22/20 (!) 150/90   Uncontrolled with Valsartan-HCTZ and Amlodipine as patient ran out of Amlodipine, refilled medications Counseled for compliance with the medications Advised DASH diet and moderate exercise/walking, at least 150 mins/week

## 2020-07-22 NOTE — Assessment & Plan Note (Signed)
Refilled Ativan 0.5 mg qHS PRN Takes it occasionally

## 2020-07-22 NOTE — Assessment & Plan Note (Signed)
Care established History and medications reviewed with the patient 

## 2020-07-22 NOTE — Progress Notes (Signed)
New Patient Office Visit  Subjective:  Patient ID: Robert Nixon, male    DOB: 12-02-1945  Age: 75 y.o. MRN: [id number]  CC:  Chief Complaint  Patient presents with  . New Patient (Initial Visit)    New patient just establishing care     HPI Robert Nixon is a 75 year old male with PMH of HTN, HLD, anxiety and tobacco abuse who presents for establishing care.  His BP was elevated today as he had run out of Amlodipine for last 1 week. He denies any headache, dizziness, chest pain, dyspnea or palpitations.  He takes Ativan occasionally for anxiety/insomnia. Denies anhedonia, SI or HI.  He smokes 1 pack/day. Denies any dyspnea or wheezing.  Past Medical History:  Diagnosis Date  . Allergy   . Anxiety   . Benign neoplasm of colon   . Closed fracture of unspecified part of fibula   . DIVERTICULOSIS OF COLON 05/20/2007   Qualifier: Diagnosis of  By: Lenna Gilford MD, Deborra Medina   . Hearing loss    no hearing aids  . Hypertension   . Lipoma of unspecified site   . Pure hypercholesterolemia    diet controlled  . Tobacco abuse     Past Surgical History:  Procedure Laterality Date  . ANKLE SURGERY     right  . COLONOSCOPY  03/10/2014   stark polyps  . INGUINAL HERNIA REPAIR    . NOSE SURGERY    . POLYPECTOMY      Family History  Problem Relation Age of Onset  . Cancer Father        neck CA  . Hearing loss Father   . Hypertension Mother   . Hearing loss Sister   . Hearing loss Brother   . Cancer Brother   . Colon cancer Maternal Uncle   . Rectal cancer Neg Hx   . Stomach cancer Neg Hx     Social History   Socioeconomic History  . Marital status: Married    Spouse name: [name]  . Number of children: Not on file  . Years of education: Not on file  . Highest education level: Not on file  Occupational History  . Not on file  Tobacco Use  . Smoking status: Current Every Day Smoker    Packs/day: 1.00    Years: 46.00    Pack years: 46.00    Types: Cigarettes   . Smokeless tobacco: Never Used  Vaping Use  . Vaping Use: Never used  Substance and Sexual Activity  . Alcohol use: Not Currently    Alcohol/week: 4.0 standard drinks    Types: 4 Shots of liquor per week    Comment: Drinks brandy at night - 4 x week, quit drinking 05/2019  . Drug use: No  . Sexual activity: Yes  Other Topics Concern  . Not on file  Social History Narrative   Married. 3 kids. 5 grandkids.       Retired International aid/development worker tobacco.       Hobbies: ride motorcycle   Social Determinants of Radio broadcast assistant Strain: Not on file  Food Insecurity: Not on file  Transportation Needs: Not on file  Physical Activity: Not on file  Stress: Not on file  Social Connections: Not on file  Intimate Partner Violence: Not on file    ROS Review of Systems  Constitutional: Negative for chills and fever.  HENT: Negative for congestion and sore throat.   Eyes: Negative for pain and discharge.  Respiratory:  Negative for cough and shortness of breath.   Cardiovascular: Negative for chest pain and palpitations.  Gastrointestinal: Negative for constipation, diarrhea, nausea and vomiting.  Endocrine: Negative for polydipsia and polyuria.  Genitourinary: Negative for dysuria and hematuria.  Musculoskeletal: Negative for neck pain and neck stiffness.  Skin: Negative for rash.  Neurological: Negative for dizziness, weakness, numbness and headaches.  Psychiatric/Behavioral: Negative for agitation and behavioral problems.    Objective:   Today's Vitals: BP (!) 150/90 (BP Location: Right Arm, Patient Position: Sitting, Cuff Size: Normal)   Pulse 94   Resp 18   Ht '6\' 1"'  (1.854 m)   Wt 182 lb 6.4 oz (82.7 kg)   SpO2 99%   BMI 24.06 kg/m   Physical Exam Vitals reviewed.  Constitutional:      General: He is not in acute distress.    Appearance: He is not diaphoretic.  HENT:     Head: Normocephalic and atraumatic.     Nose: Nose normal.     Mouth/Throat:     Mouth: Mucous  membranes are moist.  Eyes:     General: No scleral icterus.    Extraocular Movements: Extraocular movements intact.  Cardiovascular:     Rate and Rhythm: Normal rate and regular rhythm.     Pulses: Normal pulses.     Heart sounds: No murmur heard.   Pulmonary:     Breath sounds: Normal breath sounds. No wheezing or rales.  Musculoskeletal:     Cervical back: Neck supple. No tenderness.     Right lower leg: No edema.     Left lower leg: No edema.  Skin:    General: Skin is warm.     Findings: No rash.  Neurological:     General: No focal deficit present.     Mental Status: He is alert and oriented to person, place, and time.  Psychiatric:        Mood and Affect: Mood normal.        Behavior: Behavior normal.     Assessment & Plan:   Problem List Items Addressed This Visit      Encounter to establish care - Primary   Care established History and medications reviewed with the patient     Relevant Orders  TSH + free T4  Vitamin D (25 hydroxy)    Cardiovascular and Mediastinum   Essential hypertension    BP Readings from Last 1 Encounters:  07/22/20 (!) 150/90   Uncontrolled with Valsartan-HCTZ and Amlodipine as patient ran out of Amlodipine, refilled medications Counseled for compliance with the medications Advised DASH diet and moderate exercise/walking, at least 150 mins/week       Relevant Medications   amLODipine (NORVASC) 10 MG tablet   Other Relevant Orders   CBC with Differential   CMP14+EGFR     Other   HLD (hyperlipidemia)    Check lipid profile Follow DASH diet      Relevant Medications   amLODipine (NORVASC) 10 MG tablet   Other Relevant Orders   Lipid Profile   Anxiety state    Refilled Ativan 0.5 mg qHS PRN Takes it occasionally      Relevant Medications   LORazepam (ATIVAN) 0.5 MG tablet   Tobacco abuse    Smokes about 1 pack/day  Asked about quitting: confirms that he currently smokes cigarettes Advise to quit smoking:  Educated about QUITTING to reduce the risk of cancer, cardio and cerebrovascular disease. Assess willingness: Unwilling to quit at this time, but is  working on cutting back. Assist with counseling and pharmacotherapy: Counseled for 5 minutes and literature provided. Arrange for follow up: follow up in 3 months and continue to offer help.       Other Visit Diagnoses    Prostate cancer screening       Relevant Orders   PSA      Outpatient Encounter Medications as of 07/22/2020  Medication Sig  . aspirin 81 MG tablet Take 81 mg by mouth daily.  . Multiple Vitamins-Minerals (MULTIVITAMIN & MINERAL PO) Take 1 tablet by mouth daily.  . valsartan-hydrochlorothiazide (DIOVAN-HCT) 160-12.5 MG tablet TAKE 1 TABLET BY MOUTH DAILY  . [DISCONTINUED] amLODipine (NORVASC) 10 MG tablet TAKE 1 TABLET BY MOUTH EVERYDAY AT BEDTIME  . [DISCONTINUED] LORazepam (ATIVAN) 0.5 MG tablet Take 0.5 mg by mouth at bedtime.  Marland Kitchen amLODipine (NORVASC) 10 MG tablet TAKE 1 TABLET BY MOUTH EVERYDAY AT BEDTIME  . LORazepam (ATIVAN) 0.5 MG tablet Take 1 tablet (0.5 mg total) by mouth at bedtime.  . [DISCONTINUED] colchicine 0.6 MG tablet Take 2 tablets x 1 dose, then 1 tablet twice a day for 1 week, then 1 tablet daily x 1 week (Patient not taking: Reported on 07/22/2020)   No facility-administered encounter medications on file as of 07/22/2020.    Follow-up: Return in about 3 months (around 10/21/2020) for Annual physical.   Lindell Spar, MD

## 2020-07-22 NOTE — Patient Instructions (Addendum)
Please continue taking medications as prescribed.  Please follow DASH diet and exercise/ambulate as tolerated.  Please cut down --> quit smoking soon.  Please get fasting blood tests done before the next visit. [email or web address]">  DASH Eating Plan DASH stands for Dietary Approaches to Stop Hypertension. The DASH eating plan is a healthy eating plan that has been shown to:  Reduce high blood pressure (hypertension).  Reduce your risk for type 2 diabetes, heart disease, and stroke.  Help with weight loss. What are tips for following this plan? Reading food labels  Check food labels for the amount of salt (sodium) per serving. Choose foods with less than 5 percent of the Daily Value of sodium. Generally, foods with less than 300 milligrams (mg) of sodium per serving fit into this eating plan.  To find whole grains, look for the word "whole" as the first word in the ingredient list. Shopping  Buy products labeled as "low-sodium" or "no salt added."  Buy fresh foods. Avoid canned foods and pre-made or frozen meals. Cooking  Avoid adding salt when cooking. Use salt-free seasonings or herbs instead of table salt or sea salt. Check with your health care provider or pharmacist before using salt substitutes.  Do not fry foods. Cook foods using healthy methods such as baking, boiling, grilling, roasting, and broiling instead.  Cook with heart-healthy oils, such as olive, canola, avocado, soybean, or sunflower oil. Meal planning  Eat a balanced diet that includes: ? 4 or more servings of fruits and 4 or more servings of vegetables each day. Try to fill one-half of your plate with fruits and vegetables. ? 6-8 servings of whole grains each day. ? Less than 6 oz (170 g) of lean meat, poultry, or fish each day. A 3-oz (85-g) serving of meat is about the same size as a deck of cards. One egg equals 1 oz (28 g). ? 2-3 servings of low-fat dairy  each day. One serving is 1 cup (237 mL). ? 1 serving of nuts, seeds, or beans 5 times each week. ? 2-3 servings of heart-healthy fats. Healthy fats called omega-3 fatty acids are found in foods such as walnuts, flaxseeds, fortified milks, and eggs. These fats are also found in cold-water fish, such as sardines, salmon, and mackerel.  Limit how much you eat of: ? Canned or prepackaged foods. ? Food that is high in trans fat, such as some fried foods. ? Food that is high in saturated fat, such as fatty meat. ? Desserts and other sweets, sugary drinks, and other foods with added sugar. ? Full-fat dairy products.  Do not salt foods before eating.  Do not eat more than 4 egg yolks a week.  Try to eat at least 2 vegetarian meals a week.  Eat more home-cooked food and less restaurant, buffet, and fast food.   Lifestyle  When eating at a restaurant, ask that your food be prepared with less salt or no salt, if possible.  If you drink alcohol: ? Limit how much you use to:  0-1 drink a day for women who are not pregnant.  0-2 drinks a day for men. ? Be aware of how much alcohol is in your drink. In the [place] one drink equals one 12 oz bottle of beer (355 mL), one 5 oz glass of wine (148 mL), or one 1 oz glass of hard liquor (44 mL). General information  Avoid eating more than 2,300 mg of salt a day. If you have hypertension, you may need  to reduce your sodium intake to 1,500 mg a day.  Work with your health care provider to maintain a healthy body weight or to lose weight. Ask what an ideal weight is for you.  Get at least 30 minutes of exercise that causes your heart to beat faster (aerobic exercise) most days of the week. Activities may include walking, swimming, or biking.  Work with your health care provider or dietitian to adjust your eating plan to your individual calorie needs. What foods should I eat? Fruits All fresh, dried, or frozen fruit. Canned fruit in natural juice  (without added sugar). Vegetables Fresh or frozen vegetables (raw, steamed, roasted, or grilled). Low-sodium or reduced-sodium tomato and vegetable juice. Low-sodium or reduced-sodium tomato sauce and tomato paste. Low-sodium or reduced-sodium canned vegetables. Grains Whole-grain or whole-wheat bread. Whole-grain or whole-wheat pasta. Brown rice. [place]. Bulgur. Whole-grain and low-sodium cereals. Pita bread. Low-fat, low-sodium crackers. Whole-wheat flour tortillas. Meats and other proteins Skinless chicken or [place]. Ground chicken or [place]. Pork with fat trimmed off. Fish and seafood. Egg whites. Dried beans, peas, or lentils. Unsalted nuts, nut butters, and seeds. Unsalted canned beans. Lean cuts of beef with fat trimmed off. Low-sodium, lean precooked or cured meat, such as sausages or meat loaves. Dairy Low-fat (1%) or fat-free (skim) milk. Reduced-fat, low-fat, or fat-free cheeses. Nonfat, low-sodium ricotta or cottage cheese. Low-fat or nonfat yogurt. Low-fat, low-sodium cheese. Fats and oils Soft margarine without trans fats. Vegetable oil. Reduced-fat, low-fat, or light mayonnaise and salad dressings (reduced-sodium). Canola, safflower, olive, avocado, soybean, and sunflower oils. Avocado. Seasonings and condiments Herbs. Spices. Seasoning mixes without salt. Other foods Unsalted popcorn and pretzels. Fat-free sweets. The items listed above may not be a complete list of foods and beverages you can eat. Contact a dietitian for more information. What foods should I avoid? Fruits Canned fruit in a light or heavy syrup. Fried fruit. Fruit in cream or butter sauce. Vegetables Creamed or fried vegetables. Vegetables in a cheese sauce. Regular canned vegetables (not low-sodium or reduced-sodium). Regular canned tomato sauce and paste (not low-sodium or reduced-sodium). Regular tomato and vegetable juice (not low-sodium or reduced-sodium). Angie Fava. Olives. Grains Baked goods made  with fat, such as croissants, muffins, or some breads. Dry pasta or rice meal packs. Meats and other proteins Fatty cuts of meat. Ribs. Fried meat. Berniece Salines. Bologna, salami, and other precooked or cured meats, such as sausages or meat loaves. Fat from the back of a pig (fatback). Bratwurst. Salted nuts and seeds. Canned beans with added salt. Canned or smoked fish. Whole eggs or egg yolks. Chicken or [place] with skin. Dairy Whole or 2% milk, cream, and half-and-half. Whole or full-fat cream cheese. Whole-fat or sweetened yogurt. Full-fat cheese. Nondairy creamers. Whipped toppings. Processed cheese and cheese spreads. Fats and oils Butter. Stick margarine. Lard. Shortening. Ghee. Bacon fat. Tropical oils, such as coconut, palm kernel, or palm oil. Seasonings and condiments Onion salt, garlic salt, seasoned salt, table salt, and sea salt. Worcestershire sauce. Tartar sauce. Barbecue sauce. Teriyaki sauce. Soy sauce, including reduced-sodium. Steak sauce. Canned and packaged gravies. Fish sauce. Oyster sauce. Cocktail sauce. Store-bought horseradish. Ketchup. Mustard. Meat flavorings and tenderizers. Bouillon cubes. Hot sauces. Pre-made or packaged marinades. Pre-made or packaged taco seasonings. Relishes. Regular salad dressings. Other foods Salted popcorn and pretzels. The items listed above may not be a complete list of foods and beverages you should avoid. Contact a dietitian for more information. Where to find more information  National Heart, Lung, and Blood Institute:  https://wilson-eaton.com/  American Heart Association: www.heart.org  Academy of Nutrition and Dietetics: www.eatright.[place]: www.kidney.org Summary  The DASH eating plan is a healthy eating plan that has been shown to reduce high blood pressure (hypertension). It may also reduce your risk for type 2 diabetes, heart disease, and stroke.  When on the DASH eating plan, aim to eat more fresh fruits and  vegetables, whole grains, lean proteins, low-fat dairy, and heart-healthy fats.  With the DASH eating plan, you should limit salt (sodium) intake to 2,300 mg a day. If you have hypertension, you may need to reduce your sodium intake to 1,500 mg a day.  Work with your health care provider or dietitian to adjust your eating plan to your individual calorie needs. This information is not intended to replace advice given to you by your health care provider. Make sure you discuss any questions you have with your health care provider. Document Revised: 02/21/2019 Document Reviewed: 02/21/2019 Elsevier Patient Education  2021 [employer].

## 2020-07-23 ENCOUNTER — Other Ambulatory Visit: Payer: Self-pay | Admitting: Internal Medicine

## 2020-07-26 ENCOUNTER — Telehealth: Payer: Self-pay

## 2020-07-26 NOTE — Telephone Encounter (Signed)
LVM for [facility] need to know if he received lorazepam from pharmacy amlodipine should not need prior auth

## 2020-07-26 NOTE — Telephone Encounter (Signed)
Patient spouse [name]) called said pharmacy needs a prior authorization on this medicine.  amLODipine (NORVASC) 10 MG tablet   Pharmacy: [facility]

## 2020-07-28 ENCOUNTER — Ambulatory Visit: Payer: Medicare Other | Admitting: Internal Medicine

## 2020-07-28 NOTE — Telephone Encounter (Signed)
LVM for beverly to call the office regarding lorazepam

## 2020-10-12 DIAGNOSIS — Z125 Encounter for screening for malignant neoplasm of prostate: Secondary | ICD-10-CM | POA: Diagnosis not present

## 2020-10-12 DIAGNOSIS — E7800 Pure hypercholesterolemia, unspecified: Secondary | ICD-10-CM | POA: Diagnosis not present

## 2020-10-12 DIAGNOSIS — Z7689 Persons encountering health services in other specified circumstances: Secondary | ICD-10-CM | POA: Diagnosis not present

## 2020-10-12 DIAGNOSIS — I10 Essential (primary) hypertension: Secondary | ICD-10-CM | POA: Diagnosis not present

## 2020-10-13 LAB — CMP14+EGFR
ALT: 29 IU/L (ref 0–44)
AST: 30 IU/L (ref 0–40)
Albumin/Globulin Ratio: 1.6 (ref 1.2–2.2)
Albumin: 4.4 g/dL (ref 3.7–4.7)
Alkaline Phosphatase: 88 IU/L (ref 44–121)
BUN/Creatinine Ratio: 10 (ref 10–24)
BUN: 12 mg/dL (ref 8–27)
Bilirubin Total: 0.8 mg/dL (ref 0.0–1.2)
CO2: 25 mmol/L (ref 20–29)
Calcium: 10 mg/dL (ref 8.6–10.2)
Chloride: 96 mmol/L (ref 96–106)
Creatinine, Ser: 1.24 mg/dL (ref 0.76–1.27)
Globulin, Total: 2.8 g/dL (ref 1.5–4.5)
Glucose: 114 mg/dL — ABNORMAL HIGH (ref 65–99)
Potassium: 3.7 mmol/L (ref 3.5–5.2)
Sodium: 137 mmol/L (ref 134–144)
Total Protein: 7.2 g/dL (ref 6.0–8.5)
eGFR: 62 mL/min/{1.73_m2} (ref >59)

## 2020-10-13 LAB — CBC WITH DIFFERENTIAL/PLATELET
Basophils Absolute: 0 10*3/uL (ref 0.0–0.2)
Basos: 1 %
EOS (ABSOLUTE): 0.4 10*3/uL (ref 0.0–0.4)
Eos: 6 %
Hematocrit: 45.1 % (ref 37.5–51.0)
Hemoglobin: 15.1 g/dL (ref 13.0–17.7)
Immature Grans (Abs): 0 10*3/uL (ref 0.0–0.1)
Immature Granulocytes: 0 %
Lymphocytes Absolute: 1.8 10*3/uL (ref 0.7–3.1)
Lymphs: 27 %
MCH: 30.8 pg (ref 26.6–33.0)
MCHC: 33.5 g/dL (ref 31.5–35.7)
MCV: 92 fL (ref 79–97)
Monocytes Absolute: 0.6 10*3/uL (ref 0.1–0.9)
Monocytes: 9 %
Neutrophils Absolute: 3.9 10*3/uL (ref 1.4–7.0)
Neutrophils: 57 %
Platelets: 246 10*3/uL (ref 150–450)
RBC: 4.9 x10E6/uL (ref 4.14–5.80)
RDW: 12.8 % (ref 11.6–15.4)
WBC: 6.7 10*3/uL (ref 3.4–10.8)

## 2020-10-13 LAB — LIPID PANEL
Chol/HDL Ratio: 2.9 ratio (ref 0.0–5.0)
Cholesterol, Total: 231 mg/dL — ABNORMAL HIGH (ref 100–199)
HDL: 79 mg/dL (ref >39)
LDL Chol Calc (NIH): 137 mg/dL — ABNORMAL HIGH (ref 0–99)
Triglycerides: 89 mg/dL (ref 0–149)
VLDL Cholesterol Cal: 15 mg/dL (ref 5–40)

## 2020-10-13 LAB — PSA: Prostate Specific Ag, Serum: 4.1 ng/mL — ABNORMAL HIGH (ref 0.0–4.0)

## 2020-10-13 LAB — TSH+FREE T4
Free T4: 1.75 ng/dL (ref 0.82–1.77)
TSH: 1.3 u[IU]/mL (ref 0.450–4.500)

## 2020-10-13 LAB — VITAMIN D 25 HYDROXY (VIT D DEFICIENCY, FRACTURES): Vit D, 25-Hydroxy: 44.3 ng/mL (ref 30.0–100.0)

## 2020-10-21 ENCOUNTER — Encounter: Payer: Medicare Other | Admitting: Internal Medicine

## 2020-11-23 ENCOUNTER — Encounter: Payer: Medicare Other | Admitting: Internal Medicine

## 2020-12-08 ENCOUNTER — Telehealth: Payer: Self-pay | Admitting: Internal Medicine

## 2020-12-08 NOTE — Telephone Encounter (Signed)
Left message for patient to call back and schedule Medicare Annual Wellness Visit (AWV) in office.   If unable to come into the office for AWV,  please offer to do virtually or by telephone.  Last AWV: 09/05/2019  Please schedule at anytime with Mom[place].  40 minute appointment  Any questions, please contact me at [phone]

## 2020-12-11 ENCOUNTER — Other Ambulatory Visit: Payer: Self-pay

## 2020-12-11 ENCOUNTER — Ambulatory Visit (INDEPENDENT_AMBULATORY_CARE_PROVIDER_SITE_OTHER): Payer: Medicare Other | Admitting: *Deleted

## 2020-12-11 DIAGNOSIS — Z0000 Encounter for general adult medical examination without abnormal findings: Secondary | ICD-10-CM

## 2020-12-11 NOTE — Patient Instructions (Signed)
Robert Nixon , Thank you for taking time to come for your Medicare Wellness Visit. I appreciate your ongoing commitment to your health goals. Please review the following plan we discussed and let me know if I can assist you in the future.   Screening recommendations/referrals: Colonoscopy: Due 07-23-24 Recommended yearly ophthalmology/optometry visit for glaucoma screening and checkup Recommended yearly dental visit for hygiene and checkup  Vaccinations: Influenza vaccine: Due now Pneumococcal vaccine: Completed Tdap vaccine: Due now Shingles vaccine: Due now    Advanced directives: Copy requested  Conditions/risks identified: Hypertension  Next appointment: 1 year   Preventive Care 3 Years and Older, Male Preventive care refers to lifestyle choices and visits with your health care provider that can promote health and wellness. What does preventive care include? A yearly physical exam. This is also called an annual well check. Dental exams once or twice a year. Routine eye exams. Ask your health care provider how often you should have your eyes checked. Personal lifestyle choices, including: Daily care of your teeth and gums. Regular physical activity. Eating a healthy diet. Avoiding tobacco and drug use. Limiting alcohol use. Practicing safe sex. Taking low doses of aspirin every day. Taking vitamin and mineral supplements as recommended by your health care provider. What happens during an annual well check? The services and screenings done by your health care provider during your annual well check will depend on your age, overall health, lifestyle risk factors, and family history of disease. Counseling  Your health care provider may ask you questions about your: Alcohol use. Tobacco use. Drug use. Emotional well-being. Home and relationship well-being. Sexual activity. Eating habits. History of falls. Memory and ability to understand (cognition). Work and work  Statistician. Screening  You may have the following tests or measurements: Height, weight, and BMI. Blood pressure. Lipid and cholesterol levels. These may be checked every 5 years, or more frequently if you are over 48 years old. Skin check. Lung cancer screening. You may have this screening every year starting at age 31 if you have a 30-pack-year history of smoking and currently smoke or have quit within the past 15 years. Fecal occult blood test (FOBT) of the stool. You may have this test every year starting at age 8. Flexible sigmoidoscopy or colonoscopy. You may have a sigmoidoscopy every 5 years or a colonoscopy every 10 years starting at age 49. Prostate cancer screening. Recommendations will vary depending on your family history and other risks. Hepatitis C blood test. Hepatitis B blood test. Sexually transmitted disease (STD) testing. Diabetes screening. This is done by checking your blood sugar (glucose) after you have not eaten for a while (fasting). You may have this done every 1-3 years. Abdominal aortic aneurysm (AAA) screening. You may need this if you are a current or former smoker. Osteoporosis. You may be screened starting at age 71 if you are at high risk. Talk with your health care provider about your test results, treatment options, and if necessary, the need for more tests. Vaccines  Your health care provider may recommend certain vaccines, such as: Influenza vaccine. This is recommended every year. Tetanus, diphtheria, and acellular pertussis (Tdap, Td) vaccine. You may need a Td booster every 10 years. Zoster vaccine. You may need this after age 53. Pneumococcal 13-valent conjugate (PCV13) vaccine. One dose is recommended after age 76. Pneumococcal polysaccharide (PPSV23) vaccine. One dose is recommended after age 55. Talk to your health care provider about which screenings and vaccines you need and how often you  need them. This information is not intended to replace  advice given to you by your health care provider. Make sure you discuss any questions you have with your health care provider. Document Released: 04/16/2015 Document Revised: 12/08/2015 Document Reviewed: 01/19/2015 Elsevier Interactive Patient Education  2017 [employer] Prevention in the Home Falls can cause injuries. They can happen to people of all ages. There are many things you can do to make your home safe and to help prevent falls. What can I do on the outside of my home? Regularly fix the edges of walkways and driveways and fix any cracks. Remove anything that might make you trip as you walk through a door, such as a raised step or threshold. Trim any bushes or trees on the path to your home. Use bright outdoor lighting. Clear any walking paths of anything that might make someone trip, such as rocks or tools. Regularly check to see if handrails are loose or broken. Make sure that both sides of any steps have handrails. Any raised decks and porches should have guardrails on the edges. Have any leaves, snow, or ice cleared regularly. Use sand or salt on walking paths during winter. Clean up any spills in your garage right away. This includes oil or grease spills. What can I do in the bathroom? Use night lights. Install grab bars by the toilet and in the tub and shower. Do not use towel bars as grab bars. Use non-skid mats or decals in the tub or shower. If you need to sit down in the shower, use a plastic, non-slip stool. Keep the floor dry. Clean up any water that spills on the floor as soon as it happens. Remove soap buildup in the tub or shower regularly. Attach bath mats securely with double-sided non-slip rug tape. Do not have throw rugs and other things on the floor that can make you trip. What can I do in the bedroom? Use night lights. Make sure that you have a light by your bed that is easy to reach. Do not use any sheets or blankets that are too big for your bed.  They should not hang down onto the floor. Have a firm chair that has side arms. You can use this for support while you get dressed. Do not have throw rugs and other things on the floor that can make you trip. What can I do in the kitchen? Clean up any spills right away. Avoid walking on wet floors. Keep items that you use a lot in easy-to-reach places. If you need to reach something above you, use a strong step stool that has a grab bar. Keep electrical cords out of the way. Do not use floor polish or wax that makes floors slippery. If you must use wax, use non-skid floor wax. Do not have throw rugs and other things on the floor that can make you trip. What can I do with my stairs? Do not leave any items on the stairs. Make sure that there are handrails on both sides of the stairs and use them. Fix handrails that are broken or loose. Make sure that handrails are as long as the stairways. Check any carpeting to make sure that it is firmly attached to the stairs. Fix any carpet that is loose or worn. Avoid having throw rugs at the top or bottom of the stairs. If you do have throw rugs, attach them to the floor with carpet tape. Make sure that you have a light switch  at the top of the stairs and the bottom of the stairs. If you do not have them, ask someone to add them for you. What else can I do to help prevent falls? Wear shoes that: Do not have high heels. Have rubber bottoms. Are comfortable and fit you well. Are closed at the toe. Do not wear sandals. If you use a stepladder: Make sure that it is fully opened. Do not climb a closed stepladder. Make sure that both sides of the stepladder are locked into place. Ask someone to hold it for you, if possible. Clearly mark and make sure that you can see: Any grab bars or handrails. First and last steps. Where the edge of each step is. Use tools that help you move around (mobility aids) if they are needed. These  include: Canes. Walkers. Scooters. Crutches. Turn on the lights when you go into a dark area. Replace any light bulbs as soon as they burn out. Set up your furniture so you have a clear path. Avoid moving your furniture around. If any of your floors are uneven, fix them. If there are any pets around you, be aware of where they are. Review your medicines with your doctor. Some medicines can make you feel dizzy. This can increase your chance of falling. Ask your doctor what other things that you can do to help prevent falls. This information is not intended to replace advice given to you by your health care provider. Make sure you discuss any questions you have with your health care provider. Document Released: 01/14/2009 Document Revised: 08/26/2015 Document Reviewed: 04/24/2014 Elsevier Interactive Patient Education  2017 [employer].

## 2020-12-11 NOTE — Progress Notes (Signed)
Subjective:   Robert Nixon is a 75 y.o. male who presents for an Initial Medicare Annual Wellness Visit.  I connected with  [name] on 12/11/20 by an audio enabled telemedicine application and verified that I am speaking with the correct person using two identifiers.   I discussed the limitations, risks, security and privacy concerns of performing an evaluation and management service by telephone and the availability of in person appointments. I also discussed with the patient that there may be a patient responsible charge related to this service. The patient expressed understanding and verbally consented to this telephonic visit.  Review of Systems           Objective:    There were no vitals filed for this visit. There is no height or weight on file to calculate BMI.  Advanced Directives 09/05/2019 04/30/2018 02/23/2014  Does Patient Have a Medical Advance Directive? Yes Yes Yes  Type of Paramedic of [place];Living will - Living will;Healthcare Power of [place] in Chart? Yes - validated most recent copy scanned in chart (See row information) - -    Current Medications (verified) Outpatient Encounter Medications as of 12/11/2020  Medication Sig   amLODipine (NORVASC) 10 MG tablet TAKE 1 TABLET BY MOUTH EVERYDAY AT BEDTIME   aspirin 81 MG tablet Take 81 mg by mouth daily.   LORazepam (ATIVAN) 0.5 MG tablet Take 1 tablet (0.5 mg total) by mouth at bedtime.   Multiple Vitamins-Minerals (MULTIVITAMIN & MINERAL PO) Take 1 tablet by mouth daily.   valsartan-hydrochlorothiazide (DIOVAN-HCT) 160-12.5 MG tablet TAKE 1 TABLET BY MOUTH DAILY   No facility-administered encounter medications on file as of 12/11/2020.    Allergies (verified) Patient has no known allergies.   History: Past Medical History:  Diagnosis Date   Allergy    Anxiety    Benign neoplasm of colon    Closed fracture of unspecified part  of fibula    DIVERTICULOSIS OF COLON 05/20/2007   Qualifier: Diagnosis of  By: Lenna Gilford MD, Deborra Medina    Hearing loss    no hearing aids   Hypertension    Lipoma of unspecified site    Pure hypercholesterolemia    diet controlled   Tobacco abuse    Past Surgical History:  Procedure Laterality Date   ANKLE SURGERY     right   COLONOSCOPY  03/10/2014   stark polyps   INGUINAL HERNIA REPAIR     NOSE SURGERY     POLYPECTOMY     Family History  Problem Relation Age of Onset   Cancer Father        neck CA   Hearing loss Father    Hypertension Mother    Hearing loss Sister    Hearing loss Brother    Cancer Brother    Colon cancer Maternal Uncle    Rectal cancer Neg Hx    Stomach cancer Neg Hx    Social History   Socioeconomic History   Marital status: Married    Spouse name: [name]   Number of children: Not on file   Years of education: Not on file   Highest education level: Not on file  Occupational History   Not on file  Tobacco Use   Smoking status: Every Day    Packs/day: 1.00    Years: 46.00    Pack years: 46.00    Types: Cigarettes   Smokeless tobacco: Never  Vaping Use  Vaping Use: Never used  Substance and Sexual Activity   Alcohol use: Not Currently    Alcohol/week: 4.0 standard drinks    Types: 4 Shots of liquor per week    Comment: Drinks brandy at night - 4 x week, quit drinking 05/2019   Drug use: No   Sexual activity: Yes  Other Topics Concern   Not on file  Social History Narrative   Married. 3 kids. 5 grandkids.       Retired International aid/development worker tobacco.       Hobbies: ride motorcycle   Social Determinants of Radio broadcast assistant Strain: Not on file  Food Insecurity: Not on file  Transportation Needs: Not on file  Physical Activity: Not on file  Stress: Not on file  Social Connections: Not on file    Tobacco Counseling Ready to quit: Not Answered Counseling given: Not Answered   Clinical Intake:                  Diabetic?No         Activities of Daily Living No flowsheet data found.  Patient Care Team: Lindell Spar, MD as PCP - General (Internal Medicine)  Indicate any recent Medical Services you may have received from other than [facility] providers in the past year (date may be approximate).     Assessment:   This is a routine wellness examination for [place].  Hearing/Vision screen No results found.  Dietary issues and exercise activities discussed:     Goals Addressed   None   Depression Screen PHQ 2/9 Scores 07/22/2020 09/05/2019 06/02/2019 04/30/2018 10/23/2017 06/18/2017 06/12/2017  PHQ - 2 Score 0 0 1 0 0 0 0  PHQ- 9 Score - - - - - 0 0    Fall Risk Fall Risk  07/22/2020 09/05/2019 06/02/2019 04/30/2018 10/23/2017  Falls in the past year? 0 0 0 0 No  Number falls in past yr: 0 - - - -  Injury with Fall? 0 - - - -  Risk for fall due to : No Fall Risks No Fall Risks No Fall Risks - -  Follow up Falls evaluation completed Falls evaluation completed Falls evaluation completed Falls evaluation completed -    FALL RISK PREVENTION PERTAINING TO THE HOME:  Any stairs in or around the home? No  If so, are there any without handrails? No  Home free of loose throw rugs in walkways, pet beds, electrical cords, etc? Yes  Adequate lighting in your home to reduce risk of falls? Yes   ASSISTIVE DEVICES UTILIZED TO PREVENT FALLS:  Life alert? No  Use of a cane, walker or w/c? No  Grab bars in the bathroom? No  Shower chair or bench in shower? No  Elevated toilet seat or a handicapped toilet? Yes   TIMED UP AND GO:  Was the test performed? No .  Length of time to ambulate 10 feet: NA sec.     Cognitive Function:        Immunizations Immunization History  Administered Date(s) Administered   Fluad Quad(high Dose 65+) 02/04/2020   Influenza Split 01/02/2011   Influenza Whole 02/21/2010   Influenza, High Dose Seasonal PF 03/12/2014, 03/03/2019   Influenza,inj,Quad PF,6+ Mos  02/05/2015, 03/08/2016, 02/09/2017, 12/31/2017   Moderna Sars-Covid-2 Vaccination 05/11/2019, 06/11/2019   Pneumococcal Conjugate-13 11/30/2014   Tdap 11/15/2009    TDAP status: Due, Education has been provided regarding the importance of this vaccine. Advised may receive this vaccine at local pharmacy or Health  Dept. Aware to provide a copy of the vaccination record if obtained from local pharmacy or Health Dept. Verbalized acceptance and understanding.  Flu Vaccine status: Due, Education has been provided regarding the importance of this vaccine. Advised may receive this vaccine at local pharmacy or Health Dept. Aware to provide a copy of the vaccination record if obtained from local pharmacy or Health Dept. Verbalized acceptance and understanding.  Pneumococcal vaccine status: Up to date  Covid-19 vaccine status: Completed vaccines  Qualifies for Shingles Vaccine? Yes   Zostavax completed No   Shingrix Completed?: No.    Education has been provided regarding the importance of this vaccine. Patient has been advised to call insurance company to determine out of pocket expense if they have not yet received this vaccine. Advised may also receive vaccine at local pharmacy or Health Dept. Verbalized acceptance and understanding.  Screening Tests Health Maintenance  Topic Date Due   Zoster Vaccines- Shingrix (1 of 2) Never done   COVID-19 Vaccine (3 - Booster for Moderna series) 11/11/2019   TETANUS/TDAP  11/16/2019   INFLUENZA VACCINE  11/01/2020   COLONOSCOPY (Pts 45-83yr Insurance coverage will need to be confirmed)  07/23/2024   Hepatitis C Screening  Completed   PNA vac Low Risk Adult  Completed   HPV VACCINES  Aged Out    Health Maintenance  Health Maintenance Due  Topic Date Due   Zoster Vaccines- Shingrix (1 of 2) Never done   COVID-19 Vaccine (3 - Booster for Moderna series) 11/11/2019   TETANUS/TDAP  11/16/2019   INFLUENZA VACCINE  11/01/2020    Colorectal cancer  screening: Type of screening: Colonoscopy. Completed 02-04-20. Repeat every 5 years  Lung Cancer Screening: (Low Dose CT Chest recommended if Age 73-80years, 30 pack-year currently smoking OR have quit w/in 15years.) does qualify.   Lung Cancer Screening Referral:     Additional Screening:  Hepatitis C Screening: does qualify; Completed 06-17-19  Vision Screening: Recommended annual ophthalmology exams for early detection of glaucoma and other disorders of the eye. Is the patient up to date with their annual eye exam?  Yes  Who is the provider or what is the name of the office in which the patient attends annual eye exams? [facility] pt is not established with a provider, would they like to be referred to a provider to establish care? No .   Dental Screening: Recommended annual dental exams for proper oral hygiene  Community Resource Referral / Chronic Care Management: CRR required this visit?  No   CCM required this visit?  No      Plan:     I have personally reviewed and noted the following in the patient's chart:   Medical and social history Use of alcohol, tobacco or illicit drugs  Current medications and supplements including opioid prescriptions. Patient is not currently taking opioid prescriptions. Functional ability and status Nutritional status Physical activity Advanced directives List of other physicians Hospitalizations, surgeries, and ER visits in previous 12 months Vitals Screenings to include cognitive, depression, and falls Referrals and appointments  In addition, I have reviewed and discussed with patient certain preventive protocols, quality metrics, and best practice recommendations. A written personalized care plan for preventive services as well as general preventive health recommendations were provided to patient.     SShelda Altes CMA   12/11/2020   Nurse Notes: This was a telehealth visit. The patient was at home. The provider was at  home and was RIhor Dow  MD.

## 2021-01-05 ENCOUNTER — Other Ambulatory Visit: Payer: Self-pay

## 2021-01-05 ENCOUNTER — Encounter: Payer: Self-pay | Admitting: Internal Medicine

## 2021-01-05 ENCOUNTER — Ambulatory Visit (INDEPENDENT_AMBULATORY_CARE_PROVIDER_SITE_OTHER): Payer: Medicare Other | Admitting: Internal Medicine

## 2021-01-05 VITALS — BP 144/85 | Temp 97.6°F | Resp 17 | Ht 73.0 in | Wt 178.0 lb

## 2021-01-05 DIAGNOSIS — I10 Essential (primary) hypertension: Secondary | ICD-10-CM | POA: Diagnosis not present

## 2021-01-05 DIAGNOSIS — Z0001 Encounter for general adult medical examination with abnormal findings: Secondary | ICD-10-CM

## 2021-01-05 DIAGNOSIS — Z23 Encounter for immunization: Secondary | ICD-10-CM | POA: Diagnosis not present

## 2021-01-05 DIAGNOSIS — Z720 Tobacco use: Secondary | ICD-10-CM | POA: Diagnosis not present

## 2021-01-05 DIAGNOSIS — M25511 Pain in right shoulder: Secondary | ICD-10-CM

## 2021-01-05 DIAGNOSIS — E782 Mixed hyperlipidemia: Secondary | ICD-10-CM

## 2021-01-05 DIAGNOSIS — R739 Hyperglycemia, unspecified: Secondary | ICD-10-CM

## 2021-01-05 DIAGNOSIS — G8929 Other chronic pain: Secondary | ICD-10-CM | POA: Insufficient documentation

## 2021-01-05 DIAGNOSIS — R972 Elevated prostate specific antigen [PSA]: Secondary | ICD-10-CM

## 2021-01-05 MED ORDER — DICLOFENAC SODIUM 1 % EX GEL: 4.0000 g | Freq: Three times a day (TID) | CUTANEOUS | 0 refills | Status: AC | PRN

## 2021-01-05 NOTE — Assessment & Plan Note (Signed)

## 2021-01-05 NOTE — Patient Instructions (Signed)
Health Maintenance, Male Adopting a healthy lifestyle and getting preventive care are important in promoting health and wellness. Ask your health care provider about: The right schedule for you to have regular tests and exams. Things you can do on your own to prevent diseases and keep yourself healthy. What should I know about diet, weight, and exercise? Eat a healthy diet  Eat a diet that includes plenty of vegetables, fruits, low-fat dairy products, and lean protein. Do not eat a lot of foods that are high in solid fats, added sugars, or sodium. Maintain a healthy weight Body mass index (BMI) is a measurement that can be used to identify possible weight problems. It estimates body fat based on height and weight. Your health care provider can help determine your BMI and help you achieve or maintain a healthy weight. Get regular exercise Get regular exercise. This is one of the most important things you can do for your health. Most adults should: Exercise for at least 150 minutes each week. The exercise should increase your heart rate and make you sweat (moderate-intensity exercise). Do strengthening exercises at least twice a week. This is in addition to the moderate-intensity exercise. Spend less time sitting. Even light physical activity can be beneficial. Watch cholesterol and blood lipids Have your blood tested for lipids and cholesterol at 73 years of age, then have this test every 5 years. You may need to have your cholesterol levels checked more often if: Your lipid or cholesterol levels are high. You are older than 73 years of age. You are at high risk for heart disease. What should I know about cancer screening? Many types of cancers can be detected early and may often be prevented. Depending on your health history and family history, you may need to have cancer screening at various ages. This may include screening for: Colorectal cancer. Prostate cancer. Skin cancer. Lung  cancer. What should I know about heart disease, diabetes, and high blood pressure? Blood pressure and heart disease High blood pressure causes heart disease and increases the risk of stroke. This is more likely to develop in people who have high blood pressure readings, are of African descent, or are overweight. Talk with your health care provider about your target blood pressure readings. Have your blood pressure checked: Every 3-5 years if you are 18-39 years of age. Every year if you are 40 years old or older. If you are between the ages of 65 and 75 and are a current or former smoker, ask your health care provider if you should have a one-time screening for abdominal aortic aneurysm (AAA). Diabetes Have regular diabetes screenings. This checks your fasting blood sugar level. Have the screening done: Once every three years after age 45 if you are at a normal weight and have a low risk for diabetes. More often and at a younger age if you are overweight or have a high risk for diabetes. What should I know about preventing infection? Hepatitis B If you have a higher risk for hepatitis B, you should be screened for this virus. Talk with your health care provider to find out if you are at risk for hepatitis B infection. Hepatitis C Blood testing is recommended for: Everyone born from 1945 through 1965. Anyone with known risk factors for hepatitis C. Sexually transmitted infections (STIs) You should be screened each year for STIs, including gonorrhea and chlamydia, if: You are sexually active and are younger than 73 years of age. You are older than 73 years   of age and your health care provider tells you that you are at risk for this type of infection. Your sexual activity has changed since you were last screened, and you are at increased risk for chlamydia or gonorrhea. Ask your health care provider if you are at risk. Ask your health care provider about whether you are at high risk for HIV.  Your health care provider may recommend a prescription medicine to help prevent HIV infection. If you choose to take medicine to prevent HIV, you should first get tested for HIV. You should then be tested every 3 months for as long as you are taking the medicine. Follow these instructions at home: Lifestyle Do not use any products that contain nicotine or tobacco, such as cigarettes, e-cigarettes, and chewing tobacco. If you need help quitting, ask your health care provider. Do not use street drugs. Do not share needles. Ask your health care provider for help if you need support or information about quitting drugs. Alcohol use Do not drink alcohol if your health care provider tells you not to drink. If you drink alcohol: Limit how much you have to 0-2 drinks a day. Be aware of how much alcohol is in your drink. In the [place] one drink equals one 12 oz bottle of beer (355 mL), one 5 oz glass of wine (148 mL), or one 1 oz glass of hard liquor (44 mL). General instructions Schedule regular health, dental, and eye exams. Stay current with your vaccines. Tell your health care provider if: You often feel depressed. You have ever been abused or do not feel safe at home. Summary Adopting a healthy lifestyle and getting preventive care are important in promoting health and wellness. Follow your health care provider's instructions about healthy diet, exercising, and getting tested or screened for diseases. Follow your health care provider's instructions on monitoring your cholesterol and blood pressure. This information is not intended to replace advice given to you by your health care provider. Make sure you discuss any questions you have with your health care provider. Document Revised: 05/28/2020 Document Reviewed: 03/13/2018 Elsevier Patient Education  2022 Elsevier Inc.  

## 2021-01-05 NOTE — Assessment & Plan Note (Signed)
Checked lipid profile Follow DASH diet Will recheck lipid profile later, if elevated, plan to start statin

## 2021-01-05 NOTE — Progress Notes (Signed)
Established Patient Office Visit  Subjective:  Patient ID: Robert Nixon, male    DOB: 1946/02/15  Age: 75 y.o. MRN: [id number]  CC:  Chief Complaint  Patient presents with   Annual Exam    HPI Robert Nixon is a 75 year old male with PMH of HTN, HLD, anxiety and tobacco abuse who presents for annual physical.  His BP was elevated in the office today.  Of note, he was not able to sleep last night as he was with his wife in ER.  He takes his amlodipine and Diovan HCTZ regularly.  He denies any headache, dizziness, chest pain, dyspnea or palpitations.  He reports chronic right shoulder pain, for which he has seen orthopedic surgeon.  He has had steroid injections for it in the past.  He takes Tylenol as needed for it.  Blood tests were reviewed and discussed with the patient in detail.  His cholesterol was elevated, but he agrees to follow low-cholesterol and low-salt diet for now.  His PSA was elevated, chart review suggests uptrending PSA levels.  He denies any dysuria, hematuria, urinary hesitance or resistance, postvoid dribbling or worsening nocturia.  He received flu vaccine in the office today.  Past Medical History:  Diagnosis Date   Allergy    Anxiety    Benign neoplasm of colon    Closed fracture of unspecified part of fibula    DIVERTICULOSIS OF COLON 05/20/2007   Qualifier: Diagnosis of  By: Lenna Gilford MD, Deborra Medina    Hearing loss    no hearing aids   Hypertension    Lipoma of unspecified site    Pure hypercholesterolemia    diet controlled   Tobacco abuse     Past Surgical History:  Procedure Laterality Date   ANKLE SURGERY     right   COLONOSCOPY  03/10/2014   stark polyps   INGUINAL HERNIA REPAIR     NOSE SURGERY     POLYPECTOMY      Family History  Problem Relation Age of Onset   Cancer Father        neck CA   Hearing loss Father    Hypertension Mother    Hearing loss Sister    Hearing loss Brother    Cancer Brother    Colon cancer Maternal  Uncle    Rectal cancer Neg Hx    Stomach cancer Neg Hx     Social History   Socioeconomic History   Marital status: Married    Spouse name: [name]   Number of children: Not on file   Years of education: Not on file   Highest education level: Not on file  Occupational History   Not on file  Tobacco Use   Smoking status: Every Day    Packs/day: 1.00    Years: 46.00    Pack years: 46.00    Types: Cigarettes   Smokeless tobacco: Never  Vaping Use   Vaping Use: Never used  Substance and Sexual Activity   Alcohol use: Not Currently    Alcohol/week: 4.0 standard drinks    Types: 4 Shots of liquor per week    Comment: Drinks brandy at night - 4 x week, quit drinking 05/2019   Drug use: No   Sexual activity: Yes  Other Topics Concern   Not on file  Social History Narrative   Married. 3 kids. 5 grandkids.       Retired [employer] [place] [employer]: ride motorcycle  Social Determinants of Health   Financial Resource Strain: Low Risk    Difficulty of Paying Living Expenses: Not hard at all  Food Insecurity: No Food Insecurity   Worried About Charity fundraiser in the Last Year: Never true   [place] in the Last Year: Never true  Transportation Needs: No Transportation Needs   Lack of Transportation (Medical): No   Lack of Transportation (Non-Medical): No  Physical Activity: Sufficiently Active   Days of Exercise per Week: 5 days   Minutes of Exercise per Session: 60 min  Stress: No Stress Concern Present   Feeling of Stress : Not at all  Social Connections: Moderately Integrated   Frequency of Communication with Friends and Family: More than three times a week   Frequency of Social Gatherings with Friends and Family: More than three times a week   Attends Religious Services: More than 4 times per year   Active Member of [employer] or Organizations: No   Attends Archivist Meetings: Never   Marital Status: Married  Human resources officer Violence: Not At  Risk   Fear of Current or Ex-Partner: No   Emotionally Abused: No   Physically Abused: No   Sexually Abused: No    Outpatient Medications Prior to Visit  Medication Sig Dispense Refill   amLODipine (NORVASC) 10 MG tablet TAKE 1 TABLET BY MOUTH EVERYDAY AT BEDTIME 90 tablet 2   aspirin 81 MG tablet Take 81 mg by mouth daily.     LORazepam (ATIVAN) 0.5 MG tablet Take 1 tablet (0.5 mg total) by mouth at bedtime. 30 tablet 0   Multiple Vitamins-Minerals (MULTIVITAMIN & MINERAL PO) Take 1 tablet by mouth daily.     valsartan-hydrochlorothiazide (DIOVAN-HCT) 160-12.5 MG tablet TAKE 1 TABLET BY MOUTH DAILY 90 tablet 2   No facility-administered medications prior to visit.    No Known Allergies  ROS Review of Systems  Constitutional:  Negative for chills and fever.  HENT:  Negative for congestion and sore throat.   Eyes:  Negative for pain and discharge.  Respiratory:  Negative for cough and shortness of breath.   Cardiovascular:  Negative for chest pain and palpitations.  Gastrointestinal:  Negative for constipation, diarrhea, nausea and vomiting.  Endocrine: Negative for polydipsia and polyuria.  Genitourinary:  Negative for dysuria and hematuria.  Musculoskeletal:  Positive for arthralgias. Negative for neck pain and neck stiffness.  Skin:  Negative for rash.  Neurological:  Negative for dizziness, weakness, numbness and headaches.  Psychiatric/Behavioral:  Negative for agitation and behavioral problems.      Objective:    Physical Exam Vitals reviewed.  Constitutional:      General: He is not in acute distress.    Appearance: He is not diaphoretic.  HENT:     Head: Normocephalic and atraumatic.     Nose: Nose normal.     Mouth/Throat:     Mouth: Mucous membranes are moist.  Eyes:     General: No scleral icterus.    Extraocular Movements: Extraocular movements intact.  Cardiovascular:     Rate and Rhythm: Normal rate and regular rhythm.     Pulses: Normal pulses.      Heart sounds: Normal heart sounds. No murmur heard. Pulmonary:     Breath sounds: Normal breath sounds. No wheezing or rales.  Musculoskeletal:     Right shoulder: No swelling, deformity or tenderness. Decreased range of motion (Due to pain).     Cervical back: Neck supple. No tenderness.  Right lower leg: No edema.     Left lower leg: No edema.  Skin:    General: Skin is warm.     Findings: No rash.  Neurological:     General: No focal deficit present.     Mental Status: He is alert and oriented to person, place, and time.  Psychiatric:        Mood and Affect: Mood normal.        Behavior: Behavior normal.    BP (!) 144/85   Temp 97.6 F (36.4 C)   Resp 17   Ht '6\' 1"'  (1.854 m)   Wt 178 lb (80.7 kg)   SpO2 94%   BMI 23.48 kg/m  Wt Readings from Last 3 Encounters:  01/05/21 178 lb (80.7 kg)  07/22/20 182 lb 6.4 oz (82.7 kg)  09/05/19 180 lb (81.6 kg)     Health Maintenance Due  Topic Date Due   Zoster Vaccines- Shingrix (1 of 2) Never done   COVID-19 Vaccine (3 - Booster for Moderna series) 11/11/2019   TETANUS/TDAP  11/16/2019    There are no preventive care reminders to display for this patient.  Lab Results  Component Value Date   TSH 1.300 10/12/2020   Lab Results  Component Value Date   WBC 6.7 10/12/2020   HGB 15.1 10/12/2020   HCT 45.1 10/12/2020   MCV 92 10/12/2020   PLT 246 10/12/2020   Lab Results  Component Value Date   NA 137 10/12/2020   K 3.7 10/12/2020   CO2 25 10/12/2020   GLUCOSE 114 (H) 10/12/2020   BUN 12 10/12/2020   CREATININE 1.24 10/12/2020   BILITOT 0.8 10/12/2020   ALKPHOS 88 10/12/2020   AST 30 10/12/2020   ALT 29 10/12/2020   PROT 7.2 10/12/2020   ALBUMIN 4.4 10/12/2020   CALCIUM 10.0 10/12/2020   EGFR 62 10/12/2020   GFR 81.99 03/25/2013   Lab Results  Component Value Date   CHOL 231 (H) 10/12/2020   Lab Results  Component Value Date   HDL 79 10/12/2020   Lab Results  Component Value Date   LDLCALC 137  (H) 10/12/2020   Lab Results  Component Value Date   TRIG 89 10/12/2020   Lab Results  Component Value Date   CHOLHDL 2.9 10/12/2020   Lab Results  Component Value Date   HGBA1C 4.8 03/21/2017      Assessment & Plan:   Problem List Items Addressed This Visit       Encounter for general adult medical examination with abnormal findings - Primary   Physical exam as documented. Counseling done  re healthy lifestyle involving commitment to 150 minutes exercise per week, heart healthy diet, and attaining healthy weight.The importance of adequate sleep also discussed. Changes in health habits are decided on by the patient with goals and time frames  set for achieving them. Immunization and cancer screening needs are specifically addressed at this visit.       Cardiovascular and Mediastinum   Essential hypertension    BP Readings from Last 1 Encounters:  01/05/21 (!) 144/85  Elevated today, as he has not been able to sleep last night On Valsartan-HCTZ and Amlodipine Counseled for compliance with the medications Advised DASH diet and moderate exercise/walking, at least 150 mins/week       Relevant Orders   CMP14+EGFR     Other   HLD (hyperlipidemia)    Checked lipid profile Follow DASH diet Will recheck lipid profile later, if  elevated, plan to start statin      Relevant Orders   Lipid panel   Tobacco abuse    Smokes about 1 pack/day, but smokes only 1/2 cigarette, has smoked about 7-8 years in total  Asked about quitting: confirms that he currently smokes cigarettes Advise to quit smoking: Educated about QUITTING to reduce the risk of cancer, cardio and cerebrovascular disease. Assess willingness: Unwilling to quit at this time, but is working on cutting back. Assist with counseling and pharmacotherapy: Counseled for 5 minutes and literature provided. Arrange for follow up: follow up and continue to offer help.            Chronic right shoulder pain     Chronic, has seen Orthopedic surgeon -has a wound spur Has had steroid injections in the past Tylenol as needed Advised to use Voltaren gel as needed      Relevant Medications   diclofenac Sodium (VOLTAREN) 1 % GEL   Other Visit Diagnoses     Need for immunization against influenza       Relevant Orders   Flu Vaccine QUAD High Dose(Fluad) (Completed)   Elevated PSA       Relevant Orders   Ambulatory referral to Urology   Hyperglycemia       Relevant Orders   HgB A1c       Meds ordered this encounter  Medications   diclofenac Sodium (VOLTAREN) 1 % GEL    Sig: Apply 4 g topically 3 (three) times daily as needed (For shoulder pain).    Dispense:  50 g    Refill:  0    Follow-up: Return in 6 months (on 07/06/2021) for HTN and HLD.    Lindell Spar, MD

## 2021-01-05 NOTE — Assessment & Plan Note (Addendum)
Chronic, has seen Orthopedic surgeon -has a wound spur Has had steroid injections in the past Tylenol as needed Advised to use Voltaren gel as needed

## 2021-01-05 NOTE — Assessment & Plan Note (Signed)
BP Readings from Last 1 Encounters:  01/05/21 (!) 144/85   Elevated today, as he has not been able to sleep last night On Valsartan-HCTZ and Amlodipine Counseled for compliance with the medications Advised DASH diet and moderate exercise/walking, at least 150 mins/week

## 2021-01-05 NOTE — Assessment & Plan Note (Signed)
Smokes about 1 pack/day, but smokes only 1/2 cigarette, has smoked about 7-8 years in total  Asked about quitting: confirms that he currently smokes cigarettes Advise to quit smoking: Educated about QUITTING to reduce the risk of cancer, cardio and cerebrovascular disease. Assess willingness: Unwilling to quit at this time, but is working on cutting back. Assist with counseling and pharmacotherapy: Counseled for 5 minutes and literature provided. Arrange for follow up: follow up and continue to offer help.

## 2021-02-14 ENCOUNTER — Telehealth: Payer: Self-pay

## 2021-02-14 ENCOUNTER — Other Ambulatory Visit: Payer: Self-pay | Admitting: *Deleted

## 2021-02-14 MED ORDER — VALSARTAN-HYDROCHLOROTHIAZIDE 160-12.5 MG PO TABS: 1.0000 | ORAL_TABLET | Freq: Every day | ORAL | 2 refills | Status: DC

## 2021-02-14 NOTE — Telephone Encounter (Signed)
Medication sent to pt pharmacy 

## 2021-02-14 NOTE — Telephone Encounter (Signed)
Patient called need med refill  (completely out took last one this morning) request a 90 day supply.  valsartan-hydrochlorothiazide (DIOVAN-HCT) 160-12.5 MG tablet   Pharmacy: [facility]

## 2021-02-21 ENCOUNTER — Other Ambulatory Visit: Payer: Self-pay

## 2021-02-21 ENCOUNTER — Ambulatory Visit (INDEPENDENT_AMBULATORY_CARE_PROVIDER_SITE_OTHER): Payer: Medicare Other | Admitting: Urology

## 2021-02-21 ENCOUNTER — Encounter: Payer: Self-pay | Admitting: Urology

## 2021-02-21 VITALS — BP 159/90 | HR 97 | Temp 98.6°F | Wt 179.0 lb

## 2021-02-21 DIAGNOSIS — R972 Elevated prostate specific antigen [PSA]: Secondary | ICD-10-CM | POA: Diagnosis not present

## 2021-02-21 LAB — URINALYSIS, ROUTINE W REFLEX MICROSCOPIC
Bilirubin, UA: NEGATIVE
Glucose, UA: NEGATIVE
Ketones, UA: NEGATIVE
Nitrite, UA: NEGATIVE
Protein,UA: NEGATIVE
RBC, UA: NEGATIVE
Specific Gravity, UA: 1.01 (ref 1.005–1.030)
Urobilinogen, Ur: 0.2 mg/dL (ref 0.2–1.0)
pH, UA: 5.5 (ref 5.0–7.5)

## 2021-02-21 LAB — MICROSCOPIC EXAMINATION
Epithelial Cells (non renal): NONE SEEN /hpf (ref 0–10)
RBC, Urine: NONE SEEN /hpf (ref 0–2)
Renal Epithel, UA: NONE SEEN /hpf

## 2021-02-21 NOTE — Progress Notes (Signed)
02/21/2021 1:46 PM   Robert Nixon Apr 11, 1945 [id number]  Referring provider: Lindell Spar, MD [address] [place],  [place] [address]  No chief complaint on file. PSA elevation  HPI:  New patient-  1) PSA elevation-Jim's PSA June 2021 was 2.5 and July 2022 was 4.1. No h/o BPH. He did not fill out AUA symptom score. No voiding complaints. Some urgency. No prior biopsy. No FH PCa.   He does not take blood thinners. 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   PMH: Past Medical History:  Diagnosis Date   Allergy    Anxiety    Benign neoplasm of colon    Closed fracture of unspecified part of fibula    DIVERTICULOSIS OF COLON 05/20/2007   Qualifier: Diagnosis of  By: Lenna Gilford MD, Deborra Medina    Hearing loss    no hearing aids   Hypertension    Lipoma of unspecified site    Pure hypercholesterolemia    diet controlled   Tobacco abuse     Surgical History: Past Surgical History:  Procedure Laterality Date   ANKLE SURGERY     right   COLONOSCOPY  03/10/2014   stark polyps   INGUINAL HERNIA REPAIR     NOSE SURGERY     POLYPECTOMY      Home Medications:  Allergies as of 02/21/2021   No Known Allergies      Medication List        Accurate as of February 21, 2021  1:46 PM. If you have any questions, ask your nurse or doctor.          amLODipine 10 MG tablet Commonly known as: NORVASC TAKE 1 TABLET BY MOUTH EVERYDAY AT BEDTIME   aspirin 81 MG tablet Take 81 mg by mouth daily.   diclofenac Sodium 1 % Gel Commonly known as: Voltaren Apply 4 g topically 3 (three) times daily as needed (For shoulder pain).   LORazepam 0.5 MG tablet Commonly known as: ATIVAN Take 1 tablet (0.5 mg total) by mouth at bedtime.   MULTIVITAMIN & MINERAL PO Take 1 tablet by mouth daily.   valsartan-hydrochlorothiazide 160-12.5 MG tablet Commonly known as: DIOVAN-HCT Take 1 tablet by mouth daily.        Allergies: No Known Allergies  Family  History: Family History  Problem Relation Age of Onset   Cancer Father        neck CA   Hearing loss Father    Hypertension Mother    Hearing loss Sister    Hearing loss Brother    Cancer Brother    Colon cancer Maternal Uncle    Rectal cancer Neg Hx    Stomach cancer Neg Hx     Social History:  reports that he has been smoking cigarettes. He has a 46.00 pack-year smoking history. He has never used smokeless tobacco. He reports that he does not currently use alcohol after a past usage of about 4.0 standard drinks per week. He reports that he does not use drugs.   Physical Exam: BP (!) 159/90   Pulse 97   Temp 98.6 F (37 C)   Wt 179 lb (81.2 kg)   BMI 23.62 kg/m   Constitutional:  Alert and oriented, No acute distress. HEENT: [place] AT, moist mucus membranes.  Trachea midline, no masses. Cardiovascular: No clubbing, cyanosis, or edema. Respiratory: Normal respiratory effort, no increased work of breathing. GI: Abdomen is soft, nontender, nondistended, no abdominal masses GU: No  CVA tenderness Lymph: No cervical or inguinal lymphadenopathy. Skin: No rashes, bruises or suspicious lesions. Neurologic: Grossly intact, no focal deficits, moving all 4 extremities. Psychiatric: Normal mood and affect. GU: Penis circumcised, normal foreskin, testicles descended bilaterally and palpably normal, bilateral epididymis palpably normal, scrotum normal DRE: Prostate 30 g, smooth without hard area or nodule   Laboratory Data: Lab Results  Component Value Date   WBC 6.7 10/12/2020   HGB 15.1 10/12/2020   HCT 45.1 10/12/2020   MCV 92 10/12/2020   PLT 246 10/12/2020    Lab Results  Component Value Date   CREATININE 1.24 10/12/2020    Lab Results  Component Value Date   PSA 2.5 09/05/2019   PSA 1.5 04/30/2018   PSA 1.2 04/24/2017    Lab Results  Component Value Date   TESTOSTERONE 534 03/08/2016    Lab Results  Component Value Date   HGBA1C 4.8 03/21/2017     Urinalysis    Component Value Date/Time   COLORURINE LT. YELLOW 05/30/2012 1118   APPEARANCEUR CLEAR 05/30/2012 1118   LABSPEC <=1.005 05/30/2012 1118   PHURINE 7.0 05/30/2012 1118   GLUCOSEU NEGATIVE 05/30/2012 1118   HGBUR NEGATIVE 05/30/2012 1118   BILIRUBINUR NEGATIVE 05/30/2012 1118   KETONESUR NEGATIVE 05/30/2012 1118   UROBILINOGEN 0.2 05/30/2012 1118   NITRITE NEGATIVE 05/30/2012 1118   LEUKOCYTESUR TRACE 05/30/2012 1118    No results found for: LABMICR, WBCUA, RBCUA, LABEPIT, MUCUS, BACTERIA    Assessment & Plan:    1. Elevated PSA I had a long discussion with the patient on the nature of elevated PSA - benign vs malignant causes. We discussed age specific levels and that PCa can be seen on a biopsy with very low PSA levels (<=2.5). We discussed the nature risks and benefits of continued surveillance, other lab tests, imaging as well as prostate biopsy. We discussed the management of prostate cancer might include active surveillance or treatment depending on biopsy findings. All questions answered. Normal exam - PSA was sent to determine bx vs f/u.  - Urinalysis, Routine w reflex microscopic   No follow-ups on file.  Festus Aloe, MD  [facility]  [address] [place], [place] [address] [phone]

## 2021-02-21 NOTE — Progress Notes (Signed)

## 2021-02-22 LAB — PSA, TOTAL AND FREE
PSA, Free Pct: 17.3 %
PSA, Free: 0.64 ng/mL
Prostate Specific Ag, Serum: 3.7 ng/mL (ref 0.0–4.0)

## 2021-04-13 ENCOUNTER — Ambulatory Visit: Payer: Medicare Other | Admitting: Family Medicine

## 2021-04-13 ENCOUNTER — Encounter (HOSPITAL_COMMUNITY): Payer: Self-pay

## 2021-04-13 ENCOUNTER — Emergency Department (HOSPITAL_COMMUNITY): Payer: Medicare Other

## 2021-04-13 ENCOUNTER — Emergency Department (HOSPITAL_COMMUNITY)
Admission: EM | Admit: 2021-04-13 | Discharge: 2021-04-13 | Disposition: A | Discharge status: Home / Self Care | Payer: Medicare Other | Attending: Emergency Medicine | Admitting: Emergency Medicine

## 2021-04-13 ENCOUNTER — Other Ambulatory Visit: Payer: Self-pay

## 2021-04-13 DIAGNOSIS — I10 Essential (primary) hypertension: Secondary | ICD-10-CM | POA: Insufficient documentation

## 2021-04-13 DIAGNOSIS — Z7982 Long term (current) use of aspirin: Secondary | ICD-10-CM | POA: Insufficient documentation

## 2021-04-13 DIAGNOSIS — Z79899 Other long term (current) drug therapy: Secondary | ICD-10-CM | POA: Insufficient documentation

## 2021-04-13 DIAGNOSIS — R42 Dizziness and giddiness: Secondary | ICD-10-CM | POA: Diagnosis not present

## 2021-04-13 DIAGNOSIS — R9431 Abnormal electrocardiogram [ECG] [EKG]: Secondary | ICD-10-CM | POA: Diagnosis not present

## 2021-04-13 DIAGNOSIS — I639 Cerebral infarction, unspecified: Secondary | ICD-10-CM | POA: Diagnosis not present

## 2021-04-13 LAB — CBC WITH DIFFERENTIAL/PLATELET
Abs Immature Granulocytes: 0.01 10*3/uL (ref 0.00–0.07)
Basophils Absolute: 0 10*3/uL (ref 0.0–0.1)
Basophils Relative: 1 %
Eosinophils Absolute: 0.1 10*3/uL (ref 0.0–0.5)
Eosinophils Relative: 2 %
HCT: 40.2 % (ref 39.0–52.0)
Hemoglobin: 13.8 g/dL (ref 13.0–17.0)
Immature Granulocytes: 0 %
Lymphocytes Relative: 24 %
Lymphs Abs: 1.2 10*3/uL (ref 0.7–4.0)
MCH: 32.3 pg (ref 26.0–34.0)
MCHC: 34.3 g/dL (ref 30.0–36.0)
MCV: 94.1 fL (ref 80.0–100.0)
Monocytes Absolute: 0.4 10*3/uL (ref 0.1–1.0)
Monocytes Relative: 9 %
Neutro Abs: 3 10*3/uL (ref 1.7–7.7)
Neutrophils Relative %: 64 %
Platelets: 236 10*3/uL (ref 150–400)
RBC: 4.27 MIL/uL (ref 4.22–5.81)
RDW: 12.5 % (ref 11.5–15.5)
WBC: 4.8 10*3/uL (ref 4.0–10.5)
nRBC: 0 % (ref 0.0–0.2)

## 2021-04-13 LAB — COMPREHENSIVE METABOLIC PANEL
ALT: 19 U/L (ref 0–44)
AST: 21 U/L (ref 15–41)
Albumin: 4.1 g/dL (ref 3.5–5.0)
Alkaline Phosphatase: 70 U/L (ref 38–126)
Anion gap: 7 (ref 5–15)
BUN: 15 mg/dL (ref 8–23)
CO2: 29 mmol/L (ref 22–32)
Calcium: 9.6 mg/dL (ref 8.9–10.3)
Chloride: 104 mmol/L (ref 98–111)
Creatinine, Ser: 1.09 mg/dL (ref 0.61–1.24)
GFR, Estimated: >60 mL/min (ref >60)
Glucose, Bld: 149 mg/dL — ABNORMAL HIGH (ref 70–99)
Potassium: 3.7 mmol/L (ref 3.5–5.1)
Sodium: 140 mmol/L (ref 135–145)
Total Bilirubin: 0.6 mg/dL (ref 0.3–1.2)
Total Protein: 7.2 g/dL (ref 6.5–8.1)

## 2021-04-13 LAB — CBG MONITORING, ED: Glucose-Capillary: 169 mg/dL — ABNORMAL HIGH (ref 70–99)

## 2021-04-13 MED ORDER — MECLIZINE HCL 25 MG PO TABS: 25.0000 mg | ORAL_TABLET | Freq: Three times a day (TID) | ORAL | 0 refills | Status: DC | PRN

## 2021-04-13 MED ORDER — MECLIZINE HCL 12.5 MG PO TABS
25.0000 mg | ORAL_TABLET | Freq: Once | ORAL | Status: AC
  Administered 2021-04-13: 25 mg via ORAL
  Filled 2021-04-13: qty 2

## 2021-04-13 NOTE — ED Notes (Signed)
Pt c/o intermittent dizziness x 2 months. States usually gets up and goes away after walking around a little. States did not this am. Robert Nixon to sleep last night at 11pm and had no symptoms. Woke with dizziness around 7am today and didn't go away with walking. Also c/o tightness to back of head. Denies trouble swallowing/speaking. Denies weakness. See [facility]. Nad. States has had ringing in ears since sinus infection last year.

## 2021-04-13 NOTE — ED Notes (Signed)
Pt returned from mri. Nad. No changes.

## 2021-04-13 NOTE — Discharge Instructions (Signed)
Follow-up with your family doctor next week for recheck. 

## 2021-04-13 NOTE — ED Provider Notes (Signed)
[facility] EMERGENCY DEPARTMENT Provider Note   CSN: [id number] Arrival date & time: 04/13/21  1048     History  Chief Complaint  Patient presents with   Dizziness    Robert Nixon is a 76 y.o. male.  Patient complains of dizziness.  He states when he moves he feels like the room is spinning.  Patient has a history of hypertension and is given Antivert to take at home  The history is provided by the patient and medical records. No language interpreter was used.  Dizziness Quality:  Head spinning Severity:  Moderate Onset quality:  Sudden Timing:  Constant Progression:  Waxing and waning Chronicity:  New Context: not when bending over   Relieved by:  Nothing Worsened by:  Nothing Ineffective treatments:  None tried Associated symptoms: no chest pain, no diarrhea and no headaches       Home Medications Prior to Admission medications   Medication Sig Start Date End Date Taking? Authorizing Provider  amLODipine (NORVASC) 10 MG tablet TAKE 1 TABLET BY MOUTH EVERYDAY AT BEDTIME 07/22/20  Yes Lindell Spar, MD  aspirin 81 MG tablet Take 81 mg by mouth daily.   Yes [provider]  meclizine (ANTIVERT) 25 MG tablet Take 1 tablet (25 mg total) by mouth 3 (three) times daily as needed for dizziness. 04/13/21  Yes Milton Ferguson, MD  Multiple Vitamins-Minerals (MULTIVITAMIN & MINERAL PO) Take 1 tablet by mouth daily.   Yes [provider]  valsartan-hydrochlorothiazide (DIOVAN-HCT) 160-12.5 MG tablet Take 1 tablet by mouth daily. 02/14/21  Yes Lindell Spar, MD  diclofenac Sodium (VOLTAREN) 1 % GEL Apply 4 g topically 3 (three) times daily as needed (For shoulder pain). Patient not taking: Reported on 04/13/2021 01/05/21   Lindell Spar, MD  LORazepam (ATIVAN) 0.5 MG tablet Take 1 tablet (0.5 mg total) by mouth at bedtime. Patient not taking: Reported on 04/13/2021 07/22/20   Lindell Spar, MD      Allergies    Patient has no known allergies.     Review of Systems   Review of Systems  Constitutional:  Negative for appetite change and fatigue.  HENT:  Negative for congestion, ear discharge and sinus pressure.   Eyes:  Negative for discharge.  Respiratory:  Negative for cough.   Cardiovascular:  Negative for chest pain.  Gastrointestinal:  Negative for abdominal pain and diarrhea.  Genitourinary:  Negative for frequency and hematuria.  Musculoskeletal:  Negative for back pain.  Skin:  Negative for rash.  Neurological:  Positive for dizziness. Negative for seizures and headaches.  Psychiatric/Behavioral:  Negative for hallucinations.    Physical Exam Updated Vital Signs BP 136/79    Pulse 68    Resp 15    Ht 6\' 1"  (1.854 m)    Wt 81.2 kg    SpO2 99%    BMI 23.62 kg/m  Physical Exam Vitals and nursing note reviewed.  Constitutional:      Appearance: He is well-developed.  HENT:     Head: Normocephalic.     Comments: No nystagmus    Nose: Nose normal.  Eyes:     General: No scleral icterus.    Conjunctiva/sclera: Conjunctivae normal.  Neck:     Thyroid: No thyromegaly.  Cardiovascular:     Rate and Rhythm: Normal rate and regular rhythm.     Heart sounds: No murmur heard.   No friction rub. No gallop.  Pulmonary:     Breath sounds: No stridor. No  wheezing or rales.  Chest:     Chest wall: No tenderness.  Abdominal:     General: There is no distension.     Tenderness: There is no abdominal tenderness. There is no rebound.  Musculoskeletal:        General: Normal range of motion.     Cervical back: Neck supple.  Lymphadenopathy:     Cervical: No cervical adenopathy.  Skin:    Findings: No erythema or rash.  Neurological:     Mental Status: He is alert and oriented to person, place, and time.     Motor: No abnormal muscle tone.     Coordination: Coordination normal.  Psychiatric:        Behavior: Behavior normal.    ED Results / Procedures / Treatments   Labs (all labs ordered are listed, but only  abnormal results are displayed) Labs Reviewed  COMPREHENSIVE METABOLIC PANEL - Abnormal; Notable for the following components:      Result Value   Glucose, Bld 149 (*)    All other components within normal limits  CBG MONITORING, ED - Abnormal; Notable for the following components:   Glucose-Capillary 169 (*)    All other components within normal limits  CBC WITH DIFFERENTIAL/PLATELET    EKG None  Radiology MR BRAIN WO CONTRAST  Result Date: 04/13/2021 CLINICAL DATA:  Stroke follow-up. Intermittent dizziness for 2 months. EXAM: MRI HEAD WITHOUT CONTRAST TECHNIQUE: Multiplanar, multiecho pulse sequences of the brain and surrounding structures were obtained without intravenous contrast. COMPARISON:  Head CT 08/25/2010 FINDINGS: Brain: There is no evidence of an acute infarct, intracranial hemorrhage, mass, midline shift, or extra-axial fluid collection. Small T2 hyperintensities in the cerebral white matter bilaterally are nonspecific but compatible with minimal chronic small vessel ischemic disease there is mild cerebral atrophy. No focal cerebellar insult is identified. Vascular: Major intracranial vascular flow voids are preserved. Skull and upper cervical spine: No suspicious marrow lesion. Partially visualized advanced disc and facet degeneration in the cervical spine. Incomplete segmentation at C2-3. Sinuses/Orbits: Unremarkable orbits. Mild mucosal thickening in the paranasal sinuses. Clear mastoid air cells. Other: None. IMPRESSION: 1. No acute intracranial abnormality. 2. Minimal chronic small vessel ischemic disease. Electronically Signed   By: Logan Bores M.D.   On: 04/13/2021 12:59    Procedures Procedures    Medications Ordered in ED Medications  meclizine (ANTIVERT) tablet 25 mg (25 mg Oral Given 04/13/21 1137)    ED Course/ Medical Decision Making/ A&P                           Medical Decision Making  Patient with vertigo symptoms.  MRI negative.  Patient improved with  Antivert.  He will follow-up with PCP and    This patient presents to the ED for concern of dizziness, this involves an extensive number of treatment options, and is a complaint that carries with it a high risk of complications and morbidity.  The differential diagnosis includes vertigo, stroke, cerebral tumor   Co morbidities that complicate the patient evaluation  Hypertension   Additional history obtained:  Additional history obtained from patient and family External records from outside source obtained and reviewed including old records   Lab Tests:  I Ordered, and personally interpreted labs.  The pertinent results include: Glucose mildly elevated at 160   Imaging Studies ordered:  I ordered imaging studies including MRI of the head I independently visualized and interpreted imaging which showed no acute  disease I agree with the radiologist interpretation   Cardiac Monitoring:  The patient was maintained on a cardiac monitor.  I personally viewed and interpreted the cardiac monitored which showed an underlying rhythm of: Normal sinus rhythm   Medicines ordered and prescription drug management:  I ordered medication including Antivert for dizziness Reevaluation of the patient after these medicines showed that the patient improved I have reviewed the patients home medicines and have made adjustments as needed   Test Considered:  CT head   Critical Interventions:  None   Consultations Obtained:  No consultants Problem List / ED Course:  Vertigo and hypertension   Reevaluation:  After the interventions noted above, I reevaluated the patient and found that they have :improved   Social Determinants of Health:  None   Dispostion:  After consideration of the diagnostic results and the patients response to treatment, I feel that the patent would benefit from discharge home with Antivert and follow-up with PCP.         Final Clinical  Impression(s) / ED Diagnoses Final diagnoses:  Vertigo    Rx / DC Orders ED Discharge Orders          Ordered    meclizine (ANTIVERT) 25 MG tablet  3 times daily PRN        04/13/21 1509              Milton Ferguson, MD 04/16/21 [phone]

## 2021-04-13 NOTE — ED Triage Notes (Signed)
Patient complaining of dizziness that started the previous day with blurred vision.

## 2021-04-26 ENCOUNTER — Other Ambulatory Visit: Payer: Self-pay | Admitting: Internal Medicine

## 2021-04-26 DIAGNOSIS — I10 Essential (primary) hypertension: Secondary | ICD-10-CM

## 2021-05-24 ENCOUNTER — Other Ambulatory Visit: Payer: Self-pay

## 2021-05-24 ENCOUNTER — Ambulatory Visit (INDEPENDENT_AMBULATORY_CARE_PROVIDER_SITE_OTHER): Payer: Medicare Other | Admitting: Nurse Practitioner

## 2021-05-24 ENCOUNTER — Encounter: Payer: Self-pay | Admitting: Nurse Practitioner

## 2021-05-24 DIAGNOSIS — J019 Acute sinusitis, unspecified: Secondary | ICD-10-CM | POA: Diagnosis not present

## 2021-05-24 MED ORDER — SALINE SPRAY 0.65 % NA SOLN: 1.0000 | NASAL | 0 refills | Status: DC | PRN

## 2021-05-24 MED ORDER — FLUTICASONE PROPIONATE 50 MCG/ACT NA SUSP: 2.0000 | Freq: Every day | NASAL | 6 refills | Status: AC

## 2021-05-24 NOTE — Progress Notes (Signed)
Virtual Visit via Telephone Note  I connected with Robert Nixon on 05/24/21 at [phone] telephone and verified that I am speaking with the correct person using two identifiers. I spent 8 minutes on this telephone encounter  Location: Patient: HOME Provider: OFFICE   I discussed the limitations, risks, security and privacy concerns of performing an evaluation and management service by telephone and the availability of in person appointments. I also discussed with the patient that there may be a patient responsible charge related to this service. The patient expressed understanding and agreed to proceed.   History of Present Illness: Pt c/o sinus drainage,post nasal drainage,  stuffy nose, ears stuffing up a week ago . Denies fever, chills, body aches, sob, cp , wheezing. He  tried tylenol for his symptoms, no other medications  Pt is a current smoker since age 65,  need to quit smoking including risk of lung cancer, copd discussed with pt her verbalized    Observations/Objective:  Assessment and Plan: Acute rhino sinusitis.  No systemic symptoms, symptoms of sinusitis started less than 10 days ago. Start Flonase nasal spray , use 2 spray daily Saline nasal spray prn  Smoking cessation education completed , pt verbalized understanding  Follow Up Instructions:    I discussed the assessment and treatment plan with the patient. The patient was provided an opportunity to ask questions and all were answered. The patient agreed with the plan and demonstrated an understanding of the instructions.   The patient was advised to call back or seek an in-person evaluation if the symptoms worsen or if the condition fails to improve as anticipated.

## 2021-05-24 NOTE — Assessment & Plan Note (Signed)
Acute rhino sinusitis.  No systemic symptoms, symptoms of sinusitis started less than 10 days ago. Start Flonase nasal spray , use 2 spray daily Saline nasal spray prn  Smoking cessation education completed , pt verbalized understanding

## 2021-07-06 ENCOUNTER — Ambulatory Visit: Payer: Medicare Other | Admitting: Internal Medicine

## 2021-07-29 ENCOUNTER — Other Ambulatory Visit: Payer: Self-pay

## 2021-07-29 ENCOUNTER — Telehealth: Payer: Self-pay

## 2021-07-29 MED ORDER — MECLIZINE HCL 25 MG PO TABS
25.0000 mg | ORAL_TABLET | Freq: Three times a day (TID) | ORAL | 1 refills | Status: AC | PRN
Start: 2021-07-29 — End: ?

## 2021-07-29 NOTE — Telephone Encounter (Signed)
Patient called states he was given meclizine at the ER for dizziness asking for a refill on this he woke up with the same issue this morning ok to refill ? Ph# [phone] ?

## 2021-07-29 NOTE — Telephone Encounter (Signed)
Medication sent.

## 2021-08-08 ENCOUNTER — Encounter: Payer: Self-pay | Admitting: Internal Medicine

## 2021-08-08 ENCOUNTER — Ambulatory Visit (INDEPENDENT_AMBULATORY_CARE_PROVIDER_SITE_OTHER): Payer: Medicare Other | Admitting: Internal Medicine

## 2021-08-08 VITALS — BP 136/76 | HR 77 | Resp 18 | Ht 73.0 in | Wt 180.8 lb

## 2021-08-08 DIAGNOSIS — Z720 Tobacco use: Secondary | ICD-10-CM | POA: Diagnosis not present

## 2021-08-08 DIAGNOSIS — I10 Essential (primary) hypertension: Secondary | ICD-10-CM

## 2021-08-08 DIAGNOSIS — F411 Generalized anxiety disorder: Secondary | ICD-10-CM | POA: Diagnosis not present

## 2021-08-08 DIAGNOSIS — E782 Mixed hyperlipidemia: Secondary | ICD-10-CM | POA: Diagnosis not present

## 2021-08-08 DIAGNOSIS — Z23 Encounter for immunization: Secondary | ICD-10-CM

## 2021-08-08 DIAGNOSIS — H6123 Impacted cerumen, bilateral: Secondary | ICD-10-CM

## 2021-08-08 NOTE — Progress Notes (Signed)
? ?Established Patient Office Visit ? ?Subjective:  ?Patient ID: Robert Nixon, male    DOB: 30-Nov-1945  Age: 76 y.o. MRN: [id number] ? ?CC:  ?Chief Complaint  ?Patient presents with  ? Follow-up  ?  6 month follow up HTN and HLD pt has ringing in both ears went to ER 1 month ago but this has not gone away   ? ? ?HPI ?Robert Nixon is a 76 y.o. male with past medical history of HTN, HLD, anxiety and tobacco abuse who presents for f/u of his chronic medical conditions. ? ?HTN: BP is well-controlled. Takes medications regularly. Patient denies headache, dizziness, chest pain, dyspnea or palpitations. ? ?He c/o chronic nasal congestion, postnasal drip and ear fullness.  He has bilateral tinnitus as well.  He denies any fever, chills, sore throat, dyspnea or wheezing currently. ? ?GAD: He takes Ativan nightly as needed for anxiety.  He denies any anhedonia, SI or HI currently. ? ?He received PCV 20 in the office today. ? ? ?Past Medical History:  ?Diagnosis Date  ? Allergy   ? Anxiety   ? Benign neoplasm of colon   ? Closed fracture of unspecified part of fibula   ? DIVERTICULOSIS OF COLON 05/20/2007  ? Qualifier: Diagnosis of  By: Lenna Gilford MD, Deborra Medina   ? Hearing loss   ? no hearing aids  ? Hypertension   ? Lipoma of unspecified site   ? Pure hypercholesterolemia   ? diet controlled  ? Tobacco abuse   ? ? ?Past Surgical History:  ?Procedure Laterality Date  ? ANKLE SURGERY    ? right  ? COLONOSCOPY  03/10/2014  ? stark polyps  ? INGUINAL HERNIA REPAIR    ? NOSE SURGERY    ? POLYPECTOMY    ? ? ?Family History  ?Problem Relation Age of Onset  ? Cancer Father   ?     neck CA  ? Hearing loss Father   ? Hypertension Mother   ? Hearing loss Sister   ? Hearing loss Brother   ? Cancer Brother   ? Colon cancer Maternal Uncle   ? Rectal cancer Neg Hx   ? Stomach cancer Neg Hx   ? ? ?Social History  ? ?Socioeconomic History  ? Marital status: Married  ?  Spouse name: Robert Nixon  ? Number of children: Not on file  ? Years of  education: Not on file  ? Highest education level: Not on file  ?Occupational History  ? Not on file  ?Tobacco Use  ? Smoking status: Every Day  ?  Packs/day: 1.00  ?  Years: 46.00  ?  Pack years: 46.00  ?  Types: Cigarettes  ? Smokeless tobacco: Never  ?Vaping Use  ? Vaping Use: Never used  ?Substance and Sexual Activity  ? Alcohol use: Not Currently  ?  Alcohol/week: 4.0 standard drinks  ?  Types: 4 Shots of liquor per week  ?  Comment: Drinks brandy at night - 4 x week, quit drinking 05/2019  ? Drug use: No  ? Sexual activity: Yes  ?Other Topics Concern  ? Not on file  ?Social History Narrative  ? Married. 3 kids. 5 grandkids.   ?   ? Retired [employer] tobacco.   ?   ? Hobbies: ride motorcycle  ? ?Social Determinants of Health  ? ?Financial Resource Strain: Low Risk   ? Difficulty of Paying Living Expenses: Not hard at all  ?Food Insecurity: No Food Insecurity  ? Worried  About Running Out of Food in the Last Year: Never true  ? Ran Out of Food in the Last Year: Never true  ?Transportation Needs: No Transportation Needs  ? Lack of Transportation (Medical): No  ? Lack of Transportation (Non-Medical): No  ?Physical Activity: Sufficiently Active  ? Days of Exercise per Week: 5 days  ? Minutes of Exercise per Session: 60 min  ?Stress: No Stress Concern Present  ? Feeling of Stress : Not at all  ?Social Connections: Moderately Integrated  ? Frequency of Communication with Friends and Family: More than three times a week  ? Frequency of Social Gatherings with Friends and Family: More than three times a week  ? Attends Religious Services: More than 4 times per year  ? Active Member of Clubs or Organizations: No  ? Attends Archivist Meetings: Never  ? Marital Status: Married  ?Intimate Partner Violence: Not At Risk  ? Fear of Current or Ex-Partner: No  ? Emotionally Abused: No  ? Physically Abused: No  ? Sexually Abused: No  ? ? ?Outpatient Medications Prior to Visit  ?Medication Sig Dispense Refill  ? amLODipine  (NORVASC) 10 MG tablet TAKE 1 TABLET BY MOUTH EVERY DAY AT BEDTIME 90 tablet 2  ? aspirin 81 MG tablet Take 81 mg by mouth daily.    ? diclofenac Sodium (VOLTAREN) 1 % GEL Apply 4 g topically 3 (three) times daily as needed (For shoulder pain). 50 g 0  ? fluticasone (FLONASE) 50 MCG/ACT nasal spray Place 2 sprays into both nostrils daily. 16 g 6  ? LORazepam (ATIVAN) 0.5 MG tablet Take 1 tablet (0.5 mg total) by mouth at bedtime. 30 tablet 0  ? meclizine (ANTIVERT) 25 MG tablet Take 1 tablet (25 mg total) by mouth 3 (three) times daily as needed for dizziness. 30 tablet 1  ? Multiple Vitamins-Minerals (MULTIVITAMIN & MINERAL PO) Take 1 tablet by mouth daily.    ? valsartan-hydrochlorothiazide (DIOVAN-HCT) 160-12.5 MG tablet Take 1 tablet by mouth daily. 90 tablet 2  ? sodium chloride (OCEAN) 0.65 % SOLN nasal spray Place 1 spray into both nostrils as needed for congestion. 15 mL 0  ? ?No facility-administered medications prior to visit.  ? ? ?No Known Allergies ? ?ROS ?Review of Systems  ?Constitutional:  Negative for chills and fever.  ?HENT:  Positive for congestion. Negative for sore throat.   ?     Ear fullness  ?Eyes:  Negative for pain and discharge.  ?Respiratory:  Negative for cough and shortness of breath.   ?Cardiovascular:  Negative for chest pain and palpitations.  ?Gastrointestinal:  Negative for diarrhea, nausea and vomiting.  ?Endocrine: Negative for polydipsia and polyuria.  ?Genitourinary:  Negative for dysuria and hematuria.  ?Musculoskeletal:  Positive for arthralgias. Negative for neck pain and neck stiffness.  ?Skin:  Negative for rash.  ?Neurological:  Negative for dizziness, weakness, numbness and headaches.  ?Psychiatric/Behavioral:  Negative for agitation and behavioral problems.   ? ?  ?Objective:  ?  ?Physical Exam ?Vitals reviewed.  ?Constitutional:   ?   General: He is not in acute distress. ?   Appearance: He is not diaphoretic.  ?HENT:  ?   Head: Normocephalic and atraumatic.  ?    Right Ear: There is impacted cerumen.  ?   Left Ear: There is impacted cerumen.  ?   Nose: Nose normal.  ?   Mouth/Throat:  ?   Mouth: Mucous membranes are moist.  ?Eyes:  ?   General:  No scleral icterus. ?   Extraocular Movements: Extraocular movements intact.  ?Cardiovascular:  ?   Rate and Rhythm: Normal rate and regular rhythm.  ?   Pulses: Normal pulses.  ?   Heart sounds: Normal heart sounds. No murmur heard. ?Pulmonary:  ?   Breath sounds: Normal breath sounds. No wheezing or rales.  ?Musculoskeletal:  ?   Right shoulder: No swelling, deformity or tenderness. Decreased range of motion (Due to pain).  ?   Cervical back: Neck supple. No tenderness.  ?   Right lower leg: No edema.  ?   Left lower leg: No edema.  ?Skin: ?   General: Skin is warm.  ?   Findings: No rash.  ?Neurological:  ?   General: No focal deficit present.  ?   Mental Status: He is alert and oriented to person, place, and time.  ?Psychiatric:     ?   Mood and Affect: Mood normal.     ?   Behavior: Behavior normal.  ? ? ?BP 136/76 (BP Location: Right Arm, Patient Position: Sitting, Cuff Size: Normal)   Pulse 77   Resp 18   Ht '6\' 1"'  (1.854 m)   Wt 180 lb 12.8 oz (82 kg)   SpO2 96%   BMI 23.85 kg/m?  ?Wt Readings from Last 3 Encounters:  ?08/08/21 180 lb 12.8 oz (82 kg)  ?04/13/21 179 lb (81.2 kg)  ?02/21/21 179 lb (81.2 kg)  ? ? ?Lab Results  ?Component Value Date  ? TSH 1.300 10/12/2020  ? ?Lab Results  ?Component Value Date  ? WBC 4.8 04/13/2021  ? HGB 13.8 04/13/2021  ? HCT 40.2 04/13/2021  ? MCV 94.1 04/13/2021  ? PLT 236 04/13/2021  ? ?Lab Results  ?Component Value Date  ? NA 140 04/13/2021  ? K 3.7 04/13/2021  ? CO2 29 04/13/2021  ? GLUCOSE 149 (H) 04/13/2021  ? BUN 15 04/13/2021  ? CREATININE 1.09 04/13/2021  ? BILITOT 0.6 04/13/2021  ? ALKPHOS 70 04/13/2021  ? AST 21 04/13/2021  ? ALT 19 04/13/2021  ? PROT 7.2 04/13/2021  ? ALBUMIN 4.1 04/13/2021  ? CALCIUM 9.6 04/13/2021  ? ANIONGAP 7 04/13/2021  ? EGFR 62 10/12/2020  ? GFR 81.99  03/25/2013  ? ?Lab Results  ?Component Value Date  ? CHOL 231 (H) 10/12/2020  ? ?Lab Results  ?Component Value Date  ? HDL 79 10/12/2020  ? ?Lab Results  ?Component Value Date  ? LDLCALC 137 (H) 10/12/2020  ? ?Lab R

## 2021-08-08 NOTE — Patient Instructions (Signed)
Please continue taking medications as prescribed. ? ?Okay to use Debrox ear drops for excess ear wax. Please avoid using any sharp objects for cleaning purposes. ? ?Please consider getting Shingrix and Tdap vaccines at your local pharmacy. ?

## 2021-08-11 ENCOUNTER — Encounter: Payer: Self-pay | Admitting: Internal Medicine

## 2021-08-11 DIAGNOSIS — H6123 Impacted cerumen, bilateral: Secondary | ICD-10-CM | POA: Insufficient documentation

## 2021-08-11 NOTE — Assessment & Plan Note (Signed)
His ear fullness likely due to excess earwax ?Bilateral ear irrigation done today ?Advised to use Debrox eardrops as needed ?Avoid using any sharp objects for cleaning purposes ?

## 2021-08-11 NOTE — Assessment & Plan Note (Signed)
Refilled Ativan 0.5 mg qHS PRN ?Takes it occasionally ?

## 2021-08-11 NOTE — Assessment & Plan Note (Signed)
Checked lipid profile ?Follow DASH diet ?Will recheck lipid profile later, if elevated, plan to start statin ?

## 2021-08-11 NOTE — Assessment & Plan Note (Signed)
BP Readings from Last 1 Encounters:  ?08/08/21 136/76  ? ?Well-controlled with Valsartan-HCTZ and Amlodipine ?Counseled for compliance with the medications ?Advised DASH diet and moderate exercise/walking, at least 150 mins/week ? ?

## 2021-08-11 NOTE — Assessment & Plan Note (Signed)
Smokes about 1 pack/day, but smokes only 1/2 cigarette, has smoked about 7-8 years in total ? ?Asked about quitting: confirms that he currently smokes cigarettes ?Advise to quit smoking: Educated about QUITTING to reduce the risk of cancer, cardio and cerebrovascular disease. ?Assess willingness: Unwilling to quit at this time, but is working on cutting back. ?Assist with counseling and pharmacotherapy: Counseled for 5 minutes and literature provided. ?Arrange for follow up: follow up and continue to offer help. ?

## 2021-10-23 ENCOUNTER — Other Ambulatory Visit: Payer: Self-pay | Admitting: Internal Medicine

## 2021-12-15 DIAGNOSIS — H5213 Myopia, bilateral: Secondary | ICD-10-CM | POA: Diagnosis not present

## 2021-12-26 ENCOUNTER — Ambulatory Visit: Payer: Medicare Other

## 2022-01-02 ENCOUNTER — Encounter: Payer: Self-pay | Admitting: Family Medicine

## 2022-01-02 ENCOUNTER — Ambulatory Visit (INDEPENDENT_AMBULATORY_CARE_PROVIDER_SITE_OTHER): Payer: Medicare Other | Admitting: Family Medicine

## 2022-01-02 VITALS — BP 115/90 | HR 96 | Temp 97.9°F | Ht 73.0 in | Wt 182.0 lb

## 2022-01-02 DIAGNOSIS — Z136 Encounter for screening for cardiovascular disorders: Secondary | ICD-10-CM | POA: Diagnosis not present

## 2022-01-02 DIAGNOSIS — H6123 Impacted cerumen, bilateral: Secondary | ICD-10-CM

## 2022-01-02 DIAGNOSIS — Z0000 Encounter for general adult medical examination without abnormal findings: Secondary | ICD-10-CM | POA: Diagnosis not present

## 2022-01-02 DIAGNOSIS — I10 Essential (primary) hypertension: Secondary | ICD-10-CM

## 2022-01-02 DIAGNOSIS — E782 Mixed hyperlipidemia: Secondary | ICD-10-CM

## 2022-01-02 DIAGNOSIS — Z23 Encounter for immunization: Secondary | ICD-10-CM | POA: Diagnosis not present

## 2022-01-02 DIAGNOSIS — N529 Male erectile dysfunction, unspecified: Secondary | ICD-10-CM

## 2022-01-02 DIAGNOSIS — Z125 Encounter for screening for malignant neoplasm of prostate: Secondary | ICD-10-CM | POA: Diagnosis not present

## 2022-01-02 DIAGNOSIS — Z7689 Persons encountering health services in other specified circumstances: Secondary | ICD-10-CM

## 2022-01-02 MED ORDER — SILDENAFIL CITRATE 50 MG PO TABS: 50.0000 mg | ORAL_TABLET | Freq: Every day | ORAL | 0 refills | Status: DC | PRN

## 2022-01-02 NOTE — Progress Notes (Signed)
Return Patient Office Visit  Subjective    Patient ID: Robert Nixon, male    DOB: October 21, 1945  Age: 76 y.o. MRN: [id number]  CC:  Chief Complaint  Patient presents with   Establish Care   Follow-up    HPI Robert Nixon presents to re-establish care. He was re-introduced to practice routines and introduced to the nurse practitioner role. His concerns today include erectile dysfunction, he reports he has the desire but cannot obtain an erection. Discussed risk versus benefits of medical management.  He has a history of hypertension that is 115/90 controlled on Amlodipine '10mg'$  daily and Diovan-HCT 160-12.'5mg'$  daily. BP is 130/80 highest at home and he checks 1-2 weekly. Denies chest pain, headache, dizziness, dyspnea, or palpitations.  PSA elevated: last 02/21/21, seen by Festus Aloe with Urology and PSA was 3.7 at that time Colon Cancer screening UTD: colonoscopy 07/24/19- 7 polyps removed, 2 were precancerous, recommended 5 year follow up  Outpatient Encounter Medications as of 01/02/2022  Medication Sig   amLODipine (NORVASC) 10 MG tablet TAKE 1 TABLET BY MOUTH EVERY DAY AT BEDTIME   aspirin 81 MG tablet Take 81 mg by mouth daily.   diclofenac Sodium (VOLTAREN) 1 % GEL Apply 4 g topically 3 (three) times daily as needed (For shoulder pain).   fluticasone (FLONASE) 50 MCG/ACT nasal spray Place 2 sprays into both nostrils daily.   Multiple Vitamins-Minerals (MULTIVITAMIN & MINERAL PO) Take 1 tablet by mouth daily.   sildenafil (VIAGRA) 50 MG tablet Take 1 tablet (50 mg total) by mouth daily as needed for erectile dysfunction.   valsartan-hydrochlorothiazide (DIOVAN-HCT) 160-12.5 MG tablet TAKE 1 TABLET BY MOUTH DAILY   LORazepam (ATIVAN) 0.5 MG tablet Take 1 tablet (0.5 mg total) by mouth at bedtime. (Patient not taking: Reported on 01/02/2022)   meclizine (ANTIVERT) 25 MG tablet Take 1 tablet (25 mg total) by mouth 3 (three) times daily as needed for dizziness. (Patient not  taking: Reported on 01/02/2022)   No facility-administered encounter medications on file as of 01/02/2022.    Past Medical History:  Diagnosis Date   Allergy    Anxiety    Benign neoplasm of colon    Closed fracture of unspecified part of fibula    DIVERTICULOSIS OF COLON 05/20/2007   Qualifier: Diagnosis of  By: Lenna Gilford MD, Deborra Medina    Hearing loss    no hearing aids   Hypertension    Lipoma of unspecified site    Pure hypercholesterolemia    diet controlled   Tobacco abuse     Past Surgical History:  Procedure Laterality Date   ANKLE SURGERY     right   COLONOSCOPY  03/10/2014   stark polyps   INGUINAL HERNIA REPAIR     NOSE SURGERY     POLYPECTOMY      Family History  Problem Relation Age of Onset   Cancer Father        neck CA   Hearing loss Father    Hypertension Mother    Hearing loss Sister    Hearing loss Brother    Cancer Brother    Colon cancer Maternal Uncle    Rectal cancer Neg Hx    Stomach cancer Neg Hx     Social History   Socioeconomic History   Marital status: Married    Spouse name: [name]   Number of children: Not on file   Years of education: Not on file   Highest education level: Not on file  Occupational History   Not on file  Tobacco Use   Smoking status: Every Day    Packs/day: 1.00    Years: 46.00    Total pack years: 46.00    Types: Cigarettes   Smokeless tobacco: Never  Vaping Use   Vaping Use: Never used  Substance and Sexual Activity   Alcohol use: Not Currently    Alcohol/week: 4.0 standard drinks of alcohol    Types: 4 Shots of liquor per week    Comment: Drinks brandy at night - 4 x week, quit drinking 05/2019   Drug use: No   Sexual activity: Yes  Other Topics Concern   Not on file  Social History Narrative   Married. 3 kids. 5 grandkids.       Retired [place]: ride motorcycle   Social Determinants of Health   Financial Resource Strain: Low Risk  (12/11/2020)   Overall Financial  Resource Strain (CARDIA)    Difficulty of Paying Living Expenses: Not hard at all  Food Insecurity: No Food Insecurity (12/11/2020)   Hunger Vital Sign    Worried About Running Out of Food in the Last Year: Never true    Ran Out of Food in the Last Year: Never true  Transportation Needs: No Transportation Needs (12/11/2020)   PRAPARE - Hydrologist (Medical): No    Lack of Transportation (Non-Medical): No  Physical Activity: Sufficiently Active (12/11/2020)   Exercise Vital Sign    Days of Exercise per Week: 5 days    Minutes of Exercise per Session: 60 min  Stress: No Stress Concern Present (12/11/2020)   [place]    Feeling of Stress : Not at all  Social Connections: Moderately Integrated (12/11/2020)   Social Connection and Isolation Panel [NHANES]    Frequency of Communication with Friends and Family: More than three times a week    Frequency of Social Gatherings with Friends and Family: More than three times a week    Attends Religious Services: More than 4 times per year    Active Member of [employer] or Organizations: No    Attends Archivist Meetings: Never    Marital Status: Married  Human resources officer Violence: Not At Risk (12/11/2020)   Humiliation, Afraid, Rape, and Kick questionnaire    Fear of Current or Ex-Partner: No    Emotionally Abused: No    Physically Abused: No    Sexually Abused: No   The 10-year ASCVD risk score (Arnett DK, et al., 2019) is: 26.6%   Values used to calculate the score:     Age: 23 years     Sex: Male     Is Non-Hispanic African American: Yes     Diabetic: No     Tobacco smoker: Yes     Systolic Blood Pressure: 545 mmHg     Is BP treated: Yes     HDL Cholesterol: 79 mg/dL     Total Cholesterol: 231 mg/dL  Review of Systems  Constitutional: Negative.   HENT: Negative.    Eyes:  Positive for blurred vision (nearsighted).  Respiratory:  Negative.    Cardiovascular: Negative.   Gastrointestinal: Negative.   Genitourinary: Negative.   Musculoskeletal: Negative.   Skin: Negative.   Neurological: Negative.   Endo/Heme/Allergies: Negative.   Psychiatric/Behavioral: Negative.    All other systems reviewed and are negative.       Objective  BP (!) 115/90   Pulse 96   Temp 97.9 F (36.6 C) (Oral)   Ht '6\' 1"'$  (1.854 m)   Wt 182 lb (82.6 kg)   SpO2 98%   BMI 24.01 kg/m     01/02/2022    9:50 AM 08/08/2021    9:45 AM 04/13/2021    3:00 PM  Vitals with BMI  Height '6\' 1"'$  '6\' 1"'$    Weight 182 lbs 180 lbs 13 oz   BMI 27.78 24.23   Systolic 536 144 315  Diastolic 90 76 79  Pulse 96 77 68     Physical Exam Vitals and nursing note reviewed.  Constitutional:      Appearance: Normal appearance. He is normal weight.  HENT:     Head: Normocephalic and atraumatic.     Right Ear: There is impacted cerumen.     Left Ear: There is impacted cerumen.     Nose: Nose normal.     Mouth/Throat:     Mouth: Mucous membranes are moist.     Pharynx: Oropharynx is clear.  Eyes:     Extraocular Movements: Extraocular movements intact.     Conjunctiva/sclera: Conjunctivae normal.     Pupils: Pupils are equal, round, and reactive to light.  Cardiovascular:     Rate and Rhythm: Normal rate and regular rhythm.     Pulses: Normal pulses.     Heart sounds: Normal heart sounds.  Pulmonary:     Effort: Pulmonary effort is normal.     Breath sounds: Normal breath sounds.  Abdominal:     General: Abdomen is flat. Bowel sounds are normal.     Palpations: Abdomen is soft.  Musculoskeletal:        General: Normal range of motion.     Cervical back: Normal range of motion and neck supple.  Skin:    General: Skin is warm and dry.     Capillary Refill: Capillary refill takes less than 2 seconds.  Neurological:     General: No focal deficit present.     Mental Status: He is alert and oriented to person, place, and time.   Psychiatric:        Mood and Affect: Mood normal.        Behavior: Behavior normal.        Thought Content: Thought content normal.        Judgment: Judgment normal.       Assessment & Plan:  Physical exam, annual - Plan: CBC with Differential/Platelet, COMPLETE METABOLIC PANEL WITH GFR, Lipid panel  Prostate cancer screening - Plan: PSA  Essential hypertension Chronic. BP goal less than 130/80. BP 115/90 today in clinic. Well controlled on home checks. Continue Amlodipine '10mg'$  daily and Diovan-HCT 160-12.'5mg'$  daily. Continue home BP checks, heart healthy diet, exercise, and smoking cessation. Seek care for headaches, chest pain, palpitations, dizziness, or dyspnea.  Mixed hyperlipidemia  Encounter to establish care with new doctor  Erectile dysfunction, unspecified erectile dysfunction type - Plan: Testosterone, sildenafil (VIAGRA) 50 MG tablet Discussed risks of sexual activity and will seek care for dizziness, lightheadedness, chest pain, palpitations.  Excessive ear wax, bilateral Scheduled for ENT visit upcoming.  Return in about 1 year (around 01/03/2023) for annual physical.   Rubie Maid, FNP

## 2022-01-03 ENCOUNTER — Other Ambulatory Visit: Payer: Self-pay | Admitting: Family Medicine

## 2022-01-03 DIAGNOSIS — E7800 Pure hypercholesterolemia, unspecified: Secondary | ICD-10-CM

## 2022-01-03 LAB — CBC WITH DIFFERENTIAL/PLATELET
Absolute Monocytes: 474 cells/uL (ref 200–950)
Basophils Absolute: 41 cells/uL (ref 0–200)
Basophils Relative: 0.8 %
Eosinophils Absolute: 311 cells/uL (ref 15–500)
Eosinophils Relative: 6.1 %
HCT: 41.1 % (ref 38.5–50.0)
Hemoglobin: 14 g/dL (ref 13.2–17.1)
Lymphs Abs: 1785 cells/uL (ref 850–3900)
MCH: 30.8 pg (ref 27.0–33.0)
MCHC: 34.1 g/dL (ref 32.0–36.0)
MCV: 90.3 fL (ref 80.0–100.0)
MPV: 10.5 fL (ref 7.5–12.5)
Monocytes Relative: 9.3 %
Neutro Abs: 2489 cells/uL (ref 1500–7800)
Neutrophils Relative %: 48.8 %
Platelets: 239 10*3/uL (ref 140–400)
RBC: 4.55 10*6/uL (ref 4.20–5.80)
RDW: 12 % (ref 11.0–15.0)
Total Lymphocyte: 35 %
WBC: 5.1 10*3/uL (ref 3.8–10.8)

## 2022-01-03 LAB — COMPLETE METABOLIC PANEL WITH GFR
AG Ratio: 1.4 (calc) (ref 1.0–2.5)
ALT: 12 U/L (ref 9–46)
AST: 13 U/L (ref 10–35)
Albumin: 4.3 g/dL (ref 3.6–5.1)
Alkaline phosphatase (APISO): 82 U/L (ref 35–144)
BUN: 17 mg/dL (ref 7–25)
CO2: 27 mmol/L (ref 20–32)
Calcium: 10 mg/dL (ref 8.6–10.3)
Chloride: 103 mmol/L (ref 98–110)
Creat: 1.19 mg/dL (ref 0.70–1.28)
Globulin: 3 g/dL (calc) (ref 1.9–3.7)
Glucose, Bld: 96 mg/dL (ref 65–99)
Potassium: 4.3 mmol/L (ref 3.5–5.3)
Sodium: 140 mmol/L (ref 135–146)
Total Bilirubin: 0.5 mg/dL (ref 0.2–1.2)
Total Protein: 7.3 g/dL (ref 6.1–8.1)
eGFR: 64 mL/min/{1.73_m2} (ref >=60)

## 2022-01-03 LAB — TESTOSTERONE: Testosterone: 687 ng/dL (ref 250–827)

## 2022-01-03 LAB — LIPID PANEL
Cholesterol: 198 mg/dL (ref <200)
HDL: 61 mg/dL (ref >=40)
LDL Cholesterol (Calc): 120 mg/dL (calc) — ABNORMAL HIGH
Non-HDL Cholesterol (Calc): 137 mg/dL (calc) — ABNORMAL HIGH (ref <130)
Total CHOL/HDL Ratio: 3.2 (calc) (ref <5.0)
Triglycerides: 77 mg/dL (ref <150)

## 2022-01-03 LAB — PSA: PSA: 2.84 ng/mL (ref <=4.00)

## 2022-01-03 MED ORDER — ATORVASTATIN CALCIUM 40 MG PO TABS: 40.0000 mg | ORAL_TABLET | Freq: Every day | ORAL | 3 refills | Status: DC

## 2022-01-10 ENCOUNTER — Encounter: Payer: Medicare Other | Admitting: Internal Medicine

## 2022-01-17 NOTE — Addendum Note (Signed)
Addended by: Rubie Maid on: 01/17/2022 12:21 PM   Modules accepted: Level of Service

## 2022-01-21 ENCOUNTER — Other Ambulatory Visit: Payer: Self-pay | Admitting: Internal Medicine

## 2022-01-21 DIAGNOSIS — I10 Essential (primary) hypertension: Secondary | ICD-10-CM

## 2022-01-31 DIAGNOSIS — H9193 Unspecified hearing loss, bilateral: Secondary | ICD-10-CM | POA: Insufficient documentation

## 2022-01-31 DIAGNOSIS — H93293 Other abnormal auditory perceptions, bilateral: Secondary | ICD-10-CM | POA: Diagnosis not present

## 2022-01-31 DIAGNOSIS — R09A9 Foreign body sensation, other site: Secondary | ICD-10-CM | POA: Insufficient documentation

## 2022-01-31 DIAGNOSIS — H9313 Tinnitus, bilateral: Secondary | ICD-10-CM | POA: Diagnosis not present

## 2022-03-16 ENCOUNTER — Telehealth: Payer: Self-pay | Admitting: Family Medicine

## 2022-03-16 NOTE — Telephone Encounter (Signed)
Left message for patient to call back and schedule Medicare Annual Wellness Visit (AWV) in office.   If not able to come in office, please offer to do virtually or by telephone.   Last AWV:12/11/2020   Please schedule at any time with [facility]-Nurse Health Advisor.  30 minute appointment for Virtual or phone  45 minute appointment for in office or Initial virtual/phone  Any questions, please contact me at [phone]

## 2022-03-20 ENCOUNTER — Encounter: Payer: Self-pay | Admitting: Family Medicine

## 2022-03-20 ENCOUNTER — Ambulatory Visit (INDEPENDENT_AMBULATORY_CARE_PROVIDER_SITE_OTHER): Payer: Medicare Other | Admitting: Family Medicine

## 2022-03-20 VITALS — BP 130/80 | HR 81 | Temp 98.3°F | Ht 73.0 in | Wt 180.0 lb

## 2022-03-20 DIAGNOSIS — J019 Acute sinusitis, unspecified: Secondary | ICD-10-CM

## 2022-03-20 DIAGNOSIS — B9689 Other specified bacterial agents as the cause of diseases classified elsewhere: Secondary | ICD-10-CM

## 2022-03-20 MED ORDER — DOXYCYCLINE HYCLATE 100 MG PO TABS: 100.0000 mg | ORAL_TABLET | Freq: Two times a day (BID) | ORAL | 0 refills | Status: DC

## 2022-03-20 NOTE — Progress Notes (Signed)
   Acute Office Visit  Subjective:     Patient ID: Robert Nixon, male    DOB: Aug 13, 1945, 76 y.o.   MRN: [id number]  Chief Complaint  Patient presents with   Follow-up    mask--1 week sinus infection drainage in throat /slj    HPI Patient is in today for 1 week of mucopurulent sinus drainage, headache, and sinus pressure  Denies cough, fever, shortness of breath. No known sick exposures. Has tried Mucinex OTC without relief.   Review of Systems  All Robert systems reviewed and are negative.       Objective:    BP 130/80   Pulse 81   Temp 98.3 F (36.8 C) (Oral)   Ht '6\' 1"'$  (1.854 m)   Wt 180 lb (81.6 kg)   SpO2 90%   BMI 23.75 kg/m    Physical Exam Vitals and nursing note reviewed.  Constitutional:      Appearance: Normal appearance. He is normal weight.  HENT:     Head: Normocephalic and atraumatic.  Cardiovascular:     Rate and Rhythm: Normal rate and regular rhythm.     Pulses: Normal pulses.     Heart sounds: Normal heart sounds.  Pulmonary:     Effort: Pulmonary effort is normal.     Breath sounds: Examination of the right-lower field reveals rhonchi. Examination of the left-lower field reveals rhonchi. Rhonchi present.  Skin:    General: Skin is warm and dry.     Capillary Refill: Capillary refill takes less than 2 seconds.  Neurological:     General: No focal deficit present.     Mental Status: He is alert and oriented to person, place, and time. Mental status is at baseline.  Psychiatric:        Mood and Affect: Mood normal.        Behavior: Behavior normal.        Thought Content: Thought content normal.        Judgment: Judgment normal.     No results found for any visits on 03/20/22.      Assessment & Plan:   Problem List Items Addressed This Visit       Respiratory   Acute bacterial sinusitis - Primary    Patient is a 76 yo male who is a current smoker with HTN. His symptoms are consistent with progression to bacterial  sinusitis with mucopurulent drainage, sinus pressure, and symptoms persisting for 1 week. Will treat with Doxy '100mg'$  BID for 10 days. Continue OTC supportive care. Push fluids and rest. Return to office if symptoms persist or worsen.      Relevant Medications   doxycycline (VIBRA-TABS) 100 MG tablet    Meds ordered this encounter  Medications   doxycycline (VIBRA-TABS) 100 MG tablet    Sig: Take 1 tablet (100 mg total) by mouth 2 (two) times daily.    Dispense:  20 tablet    Refill:  0    Order Specific Question:   Supervising Provider    Answer:   Jenna Luo T [[id number]]    Return if symptoms worsen or fail to improve.  Rubie Maid, FNP

## 2022-03-20 NOTE — Assessment & Plan Note (Signed)
Patient is a 76 yo male who is a current smoker with HTN. His symptoms are consistent with progression to bacterial sinusitis with mucopurulent drainage, sinus pressure, and symptoms persisting for 1 week. Will treat with Doxy '100mg'$  BID for 10 days. Continue OTC supportive care. Push fluids and rest. Return to office if symptoms persist or worsen.

## 2022-05-16 ENCOUNTER — Other Ambulatory Visit: Payer: Self-pay

## 2022-05-16 ENCOUNTER — Ambulatory Visit (INDEPENDENT_AMBULATORY_CARE_PROVIDER_SITE_OTHER): Payer: Medicare Other

## 2022-05-16 VITALS — Ht 73.0 in | Wt 180.0 lb

## 2022-05-16 DIAGNOSIS — Z0000 Encounter for general adult medical examination without abnormal findings: Secondary | ICD-10-CM

## 2022-05-16 DIAGNOSIS — N529 Male erectile dysfunction, unspecified: Secondary | ICD-10-CM

## 2022-05-16 MED ORDER — SILDENAFIL CITRATE 50 MG PO TABS: 50.0000 mg | ORAL_TABLET | Freq: Every day | ORAL | 0 refills | Status: AC | PRN

## 2022-05-16 NOTE — Progress Notes (Signed)
Subjective:   Robert Nixon is a 77 y.o. male who presents for Medicare Annual/Subsequent preventive examination.  I connected with  Robert Nixon on 05/16/22 by a audio enabled telemedicine application and verified that I am speaking with the correct person using two identifiers.  Patient Location: Home  Provider Location: Office/Clinic  I discussed the limitations of evaluation and management by telemedicine. The patient expressed understanding and agreed to proceed.  Review of Systems     Cardiac Risk Factors include: advanced age (>64mn, >>48women);dyslipidemia;smoking/ tobacco exposure;hypertension;male gender     Objective:    Today's Vitals   05/16/22 1459  Weight: 180 lb (81.6 kg)  Height: 6' 1"$  (1.854 m)   Body mass index is 23.75 kg/m.     05/16/2022    3:10 PM 04/13/2021   10:59 AM 12/11/2020    8:34 AM 09/05/2019    3:07 PM 04/30/2018    8:33 AM 02/23/2014    9:04 AM  Advanced Directives  Does Patient Have a Medical Advance Directive? Yes No Yes Yes Yes Yes  Type of Advance Directive Living will;Healthcare Power of Attorney  [place]  Living will;Healthcare Power of Attorney  Does patient want to make changes to medical advance directive? No - Patient declined  No - Patient declined     [place] of [place]? No - copy requested   Yes - validated most recent copy scanned in chart (See row information)      Current Medications (verified) Outpatient Encounter Medications as of 05/16/2022  Medication Sig   amLODipine (NORVASC) 10 MG tablet TAKE 1 TABLET BY MOUTH EVERY DAY AT BEDTIME   aspirin 81 MG tablet Take 81 mg by mouth daily.   atorvastatin (LIPITOR) 40 MG tablet Take 1 tablet (40 mg total) by mouth daily.   diclofenac Sodium (VOLTAREN) 1 % GEL Apply 4 g topically 3 (three) times daily as needed (For shoulder pain).   fluticasone (FLONASE) 50 MCG/ACT nasal spray Place 2 sprays into  both nostrils daily.   LORazepam (ATIVAN) 0.5 MG tablet Take 1 tablet (0.5 mg total) by mouth at bedtime.   Multiple Vitamins-Minerals (MULTIVITAMIN & MINERAL PO) Take 1 tablet by mouth daily.   sildenafil (VIAGRA) 50 MG tablet Take 1 tablet (50 mg total) by mouth daily as needed for erectile dysfunction.   valsartan-hydrochlorothiazide (DIOVAN-HCT) 160-12.5 MG tablet TAKE 1 TABLET BY MOUTH DAILY   meclizine (ANTIVERT) 25 MG tablet Take 1 tablet (25 mg total) by mouth 3 (three) times daily as needed for dizziness. (Patient not taking: Reported on 05/16/2022)   [DISCONTINUED] doxycycline (VIBRA-TABS) 100 MG tablet Take 1 tablet (100 mg total) by mouth 2 (two) times daily.   No facility-administered encounter medications on file as of 05/16/2022.    Allergies (verified) Patient has no known allergies.   History: Past Medical History:  Diagnosis Date   Allergy    Anxiety    Benign neoplasm of colon    Closed fracture of unspecified part of fibula    DIVERTICULOSIS OF COLON 05/20/2007   Qualifier: Diagnosis of  By: NLenna GilfordMD, SDeborra Medina   Hearing loss    no hearing aids   Hypertension    Lipoma of unspecified site    Pure hypercholesterolemia    diet controlled   Tobacco abuse    Past Surgical History:  Procedure Laterality Date   ANKLE SURGERY     right   COLONOSCOPY  03/10/2014   stark polyps   INGUINAL HERNIA REPAIR     NOSE SURGERY     POLYPECTOMY     Family History  Problem Relation Age of Onset   Cancer Father        neck CA   Hearing loss Father    Hypertension Mother    Hearing loss Sister    Hearing loss Brother    Cancer Brother    Colon cancer Maternal Uncle    Rectal cancer Neg Hx    Stomach cancer Neg Hx    Social History   Socioeconomic History   Marital status: Married    Spouse name: Robert Nixon   Number of children: Not on file   Years of education: Not on file   Highest education level: Not on file  Occupational History   Not on file  Tobacco Use    Smoking status: Every Day    Packs/day: 1.00    Years: 15.00    Total pack years: 15.00    Types: Cigarettes   Smokeless tobacco: Never  Vaping Use   Vaping Use: Never used  Substance and Sexual Activity   Alcohol use: Not Currently    Alcohol/week: 4.0 standard drinks of alcohol    Types: 4 Shots of liquor per week    Comment: Drinks brandy at night - 4 x week, quit drinking 05/2019   Drug use: No   Sexual activity: Yes  Other Topics Concern   Not on file  Social History Narrative   Still works part time    Married. 3 kids. 5 grandkids.       Retired International aid/development worker tobacco.       Hobbies: ride motorcycle   Social Determinants of Health   Financial Resource Strain: Low Risk  (05/16/2022)   Overall Financial Resource Strain (CARDIA)    Difficulty of Paying Living Expenses: Not hard at all  Food Insecurity: No Food Insecurity (05/16/2022)   Hunger Vital Sign    Worried About Running Out of Food in the Last Year: Never true    [place] in the Last Year: Never true  Transportation Needs: No Transportation Needs (05/16/2022)   PRAPARE - Hydrologist (Medical): No    Lack of Transportation (Non-Medical): No  Physical Activity: Sufficiently Active (05/16/2022)   Exercise Vital Sign    Days of Exercise per Week: 5 days    Minutes of Exercise per Session: 60 min  Stress: No Stress Concern Present (05/16/2022)   [place]    Feeling of Stress : Not at all  Social Connections: Moderately Integrated (05/16/2022)   Social Connection and Isolation Panel [NHANES]    Frequency of Communication with Friends and Family: More than three times a week    Frequency of Social Gatherings with Friends and Family: Three times a week    Attends Religious Services: More than 4 times per year    Active Member of Clubs or Organizations: No    Attends Archivist Meetings: Never    Marital Status:  Married    Tobacco Counseling Ready to quit: Not Answered Counseling given: Not Answered   Clinical Intake:  Pre-visit preparation completed: Yes  Pain : No/denies pain  Diabetes: No  How often do you need to have someone help you when you read instructions, pamphlets, or other written materials from your doctor or pharmacy?: 1 - Never  Diabetic?No   Interpreter  Needed?: No  Information entered by :: Denman George LPN   Activities of Daily Living    05/16/2022    3:10 PM 05/16/2022    3:02 PM  In your present state of health, do you have any difficulty performing the following activities:  Hearing? 0 0  Vision? 0 0  Difficulty concentrating or making decisions? 0 0  Walking or climbing stairs? 0 0  Dressing or bathing? 0 0  Doing errands, shopping? 0 0  Preparing Food and eating ? N N  Using the Toilet? N N  In the past six months, have you accidently leaked urine? N N  Do you have problems with loss of bowel control? N N  Managing your Medications? N N  Managing your Finances? N N  Housekeeping or managing your Housekeeping? N N    Patient Care Team: Rubie Maid, FNP as PCP - General (Family Medicine)  Indicate any recent Medical Services you may have received from other than [facility] providers in the past year (date may be approximate).     Assessment:   This is a routine wellness examination for Robert Nixon.  Hearing/Vision screen Hearing Screening - Comments:: Some hearing loss  Vision Screening - Comments:: Wears rx glasses - up to date with routine eye exams    Dietary issues and exercise activities discussed: Current Exercise Habits: The patient does not participate in regular exercise at present   Goals Addressed             This Visit's Progress    Patient Stated   On track    Would like to just keep working and moving       Depression Screen    05/16/2022    3:08 PM 03/20/2022    3:42 PM 01/02/2022   10:01 AM 01/02/2022    9:49 AM  08/08/2021    9:46 AM 05/24/2021    9:08 AM 01/05/2021    1:34 PM  PHQ 2/9 Scores  PHQ - 2 Score 0 0 1 0 0 0 0  PHQ- 9 Score  0 2 0   0    Fall Risk    05/16/2022    2:59 PM 03/20/2022    3:42 PM 03/20/2022    3:29 PM 01/02/2022    9:49 AM 08/08/2021    9:46 AM  [place] in the past year? 0 0 0 0 0  Number falls in past yr: 0   0 0  Injury with Fall? 0   0 0  Risk for fall due to :     No Fall Risks  Follow up Falls prevention discussed;Education provided;Falls evaluation completed    Falls evaluation completed    FALL RISK PREVENTION PERTAINING TO THE HOME:  Any stairs in or around the home? No  If so, are there any without handrails? No  Home free of loose throw rugs in walkways, pet beds, electrical cords, etc? Yes  Adequate lighting in your home to reduce risk of falls? Yes   ASSISTIVE DEVICES UTILIZED TO PREVENT FALLS:  Life alert? No  Use of a cane, walker or w/c? No  Grab bars in the bathroom? Yes  Shower chair or bench in shower? No  Elevated toilet seat or a handicapped toilet? Yes   TIMED UP AND GO:  Was the test performed? No . Telephonic visit   Cognitive Function:    12/11/2020    8:36 AM  MMSE - Mini Mental State Exam  Not completed: Unable to complete        05/16/2022    3:10 PM 12/11/2020    8:36 AM  6CIT Screen  What Year? 0 points 0 points  What month? 0 points 0 points  What time? 0 points 0 points  Count back from 20 0 points 0 points  Months in reverse 0 points 0 points  Repeat phrase 0 points 0 points  Total Score 0 points 0 points    Immunizations Immunization History  Administered Date(s) Administered   Fluad Quad(high Dose 65+) 02/04/2020, 01/05/2021, 01/02/2022   Influenza Split 01/02/2011   Influenza Whole 02/21/2010   Influenza, High Dose Seasonal PF 03/12/2014, 03/03/2019   Influenza,inj,Quad PF,6+ Mos 02/05/2015, 03/08/2016, 02/09/2017, 12/31/2017   Moderna Sars-Covid-2 Vaccination 05/11/2019, 06/11/2019    PNEUMOCOCCAL CONJUGATE-20 08/08/2021   Pneumococcal Conjugate-13 11/30/2014   Tdap 11/15/2009, 01/02/2022   Zoster Recombinat (Shingrix) 06/13/2021    TDAP status: Up to date  Flu Vaccine status: Up to date  Pneumococcal vaccine status: Up to date  Covid-19 vaccine status: Information provided on how to obtain vaccines.   Qualifies for Shingles Vaccine? Yes   Zostavax completed No   Shingrix Completed?: No.    Education has been provided regarding the importance of this vaccine. Patient has been advised to call insurance company to determine out of pocket expense if they have not yet received this vaccine. Advised may also receive vaccine at local pharmacy or Health Dept. Verbalized acceptance and understanding.  Screening Tests Health Maintenance  Topic Date Due   Zoster Vaccines- Shingrix (2 of 2) 08/08/2021   COVID-19 Vaccine (3 - 2023-24 season) 12/02/2021   Medicare Annual Wellness (AWV)  05/17/2023   COLONOSCOPY (Pts 45-14yr Insurance coverage will need to be confirmed)  07/23/2024   DTaP/Tdap/Td (3 - Td or Tdap) 01/03/2032   Pneumonia Vaccine 75 Years old  Completed   INFLUENZA VACCINE  Completed   Hepatitis C Screening  Completed   HPV VACCINES  Aged Out    Health Maintenance  Health Maintenance Due  Topic Date Due   Zoster Vaccines- Shingrix (2 of 2) 08/08/2021   COVID-19 Vaccine (3 - 2023-24 season) 12/02/2021    Colorectal cancer screening: Type of screening: Colonoscopy. Completed 07/24/19. Repeat every 5 years  Lung Cancer Screening: (Low Dose CT Chest recommended if Age 75-80years, 30 pack-year currently smoking OR have quit w/in 15years.) does not qualify.   Lung Cancer Screening Referral: n/a  Additional Screening:  Hepatitis C Screening: does qualify; Completed 06/17/19  Vision Screening: Recommended annual ophthalmology exams for early detection of glaucoma and other disorders of the eye. Is the patient up to date with their annual eye exam?  Yes   Who is the provider or what is the name of the office in which the patient attends annual eye exams? Unable to provide name  If pt is not established with a provider, would they like to be referred to a provider to establish care? No .   Dental Screening: Recommended annual dental exams for proper oral hygiene  Community Resource Referral / Chronic Care Management: CRR required this visit?  No   CCM required this visit?  No      Plan:     I have personally reviewed and noted the following in the patient's chart:   Medical and social history Use of alcohol, tobacco or illicit drugs  Current medications and supplements including opioid prescriptions. Patient is not currently taking opioid prescriptions. Functional ability and status  Nutritional status Physical activity Advanced directives List of other physicians Hospitalizations, surgeries, and ER visits in previous 12 months Vitals Screenings to include cognitive, depression, and falls Referrals and appointments  In addition, I have reviewed and discussed with patient certain preventive protocols, quality metrics, and best practice recommendations. A written personalized care plan for preventive services as well as general preventive health recommendations were provided to patient.     Denman George [place], [place]   [id number]   Nurse Notes: Patient is requesting a refill of Viagra; will route as a refill request.

## 2022-05-16 NOTE — Patient Instructions (Addendum)
[name] , Thank you for taking time to come for your Medicare Wellness Visit. I appreciate your ongoing commitment to your health goals. Please review the following plan we discussed and let me know if I can assist you in the future.   These are the goals we discussed:  Goals      Patient Stated     Would like to just keep working and moving        This is a list of the screening recommended for you and due dates:  Health Maintenance  Topic Date Due   Zoster (Shingles) Vaccine (2 of 2) 08/08/2021   COVID-19 Vaccine (3 - 2023-24 season) 12/02/2021   Medicare Annual Wellness Visit  05/17/2023   Colon Cancer Screening  07/23/2024   DTaP/Tdap/Td vaccine (3 - Td or Tdap) 01/03/2032   Pneumonia Vaccine  Completed   Flu Shot  Completed   Hepatitis C Screening: USPSTF Recommendation to screen - Ages 75-79 yo.  Completed   HPV Vaccine  Aged Out    Advanced directives: Please bring a copy of your health care power of attorney and living will to the office to be added to your chart at your convenience.   Conditions/risks identified: Aim for 30 minutes of exercise or brisk walking, 6-8 glasses of water, and 5 servings of fruits and vegetables each day.   Next appointment: Follow up in one year for your annual wellness visit.   Preventive Care 55 Years and Older, Male  Preventive care refers to lifestyle choices and visits with your health care provider that can promote health and wellness. What does preventive care include? A yearly physical exam. This is also called an annual well check. Dental exams once or twice a year. Routine eye exams. Ask your health care provider how often you should have your eyes checked. Personal lifestyle choices, including: Daily care of your teeth and gums. Regular physical activity. Eating a healthy diet. Avoiding tobacco and drug use. Limiting alcohol use. Practicing safe sex. Taking low doses of aspirin every day. Taking vitamin and mineral  supplements as recommended by your health care provider. What happens during an annual well check? The services and screenings done by your health care provider during your annual well check will depend on your age, overall health, lifestyle risk factors, and family history of disease. Counseling  Your health care provider may ask you questions about your: Alcohol use. Tobacco use. Drug use. Emotional well-being. Home and relationship well-being. Sexual activity. Eating habits. History of falls. Memory and ability to understand (cognition). Work and work Statistician. Screening  You may have the following tests or measurements: Height, weight, and BMI. Blood pressure. Lipid and cholesterol levels. These may be checked every 5 years, or more frequently if you are over 61 years old. Skin check. Lung cancer screening. You may have this screening every year starting at age 75 if you have a 30-pack-year history of smoking and currently smoke or have quit within the past 15 years. Fecal occult blood test (FOBT) of the stool. You may have this test every year starting at age 75. Flexible sigmoidoscopy or colonoscopy. You may have a sigmoidoscopy every 5 years or a colonoscopy every 10 years starting at age 75. Prostate cancer screening. Recommendations will vary depending on your family history and other risks. Hepatitis C blood test. Hepatitis B blood test. Sexually transmitted disease (STD) testing. Diabetes screening. This is done by checking your blood sugar (glucose) after you have not eaten for  a while (fasting). You may have this done every 1-3 years. Abdominal aortic aneurysm (AAA) screening. You may need this if you are a current or former smoker. Osteoporosis. You may be screened starting at age 11 if you are at high risk. Talk with your health care provider about your test results, treatment options, and if necessary, the need for more tests. Vaccines  Your health care provider  may recommend certain vaccines, such as: Influenza vaccine. This is recommended every year. Tetanus, diphtheria, and acellular pertussis (Tdap, Td) vaccine. You may need a Td booster every 10 years. Zoster vaccine. You may need this after age 75. Pneumococcal 13-valent conjugate (PCV13) vaccine. One dose is recommended after age 75. Pneumococcal polysaccharide (PPSV23) vaccine. One dose is recommended after age 75. Talk to your health care provider about which screenings and vaccines you need and how often you need them. This information is not intended to replace advice given to you by your health care provider. Make sure you discuss any questions you have with your health care provider. Document Released: 04/16/2015 Document Revised: 12/08/2015 Document Reviewed: 01/19/2015 Elsevier Interactive Patient Education  2017 [place] Prevention in the Home Falls can cause injuries. They can happen to people of all ages. There are many things you can do to make your home safe and to help prevent falls. What can I do on the outside of my home? Regularly fix the edges of walkways and driveways and fix any cracks. Remove anything that might make you trip as you walk through a door, such as a raised step or threshold. Trim any bushes or trees on the path to your home. Use bright outdoor lighting. Clear any walking paths of anything that might make someone trip, such as rocks or tools. Regularly check to see if handrails are loose or broken. Make sure that both sides of any steps have handrails. Any raised decks and porches should have guardrails on the edges. Have any leaves, snow, or ice cleared regularly. Use sand or salt on walking paths during winter. Clean up any spills in your garage right away. This includes oil or grease spills. What can I do in the bathroom? Use night lights. Install grab bars by the toilet and in the tub and shower. Do not use towel bars as grab bars. Use  non-skid mats or decals in the tub or shower. If you need to sit down in the shower, use a plastic, non-slip stool. Keep the floor dry. Clean up any water that spills on the floor as soon as it happens. Remove soap buildup in the tub or shower regularly. Attach bath mats securely with double-sided non-slip rug tape. Do not have throw rugs and other things on the floor that can make you trip. What can I do in the bedroom? Use night lights. Make sure that you have a light by your bed that is easy to reach. Do not use any sheets or blankets that are too big for your bed. They should not hang down onto the floor. Have a firm chair that has side arms. You can use this for support while you get dressed. Do not have throw rugs and other things on the floor that can make you trip. What can I do in the kitchen? Clean up any spills right away. Avoid walking on wet floors. Keep items that you use a lot in easy-to-reach places. If you need to reach something above you, use a strong step stool that has a  grab bar. Keep electrical cords out of the way. Do not use floor polish or wax that makes floors slippery. If you must use wax, use non-skid floor wax. Do not have throw rugs and other things on the floor that can make you trip. What can I do with my stairs? Do not leave any items on the stairs. Make sure that there are handrails on both sides of the stairs and use them. Fix handrails that are broken or loose. Make sure that handrails are as long as the stairways. Check any carpeting to make sure that it is firmly attached to the stairs. Fix any carpet that is loose or worn. Avoid having throw rugs at the top or bottom of the stairs. If you do have throw rugs, attach them to the floor with carpet tape. Make sure that you have a light switch at the top of the stairs and the bottom of the stairs. If you do not have them, ask someone to add them for you. What else can I do to help prevent falls? Wear  shoes that: Do not have high heels. Have rubber bottoms. Are comfortable and fit you well. Are closed at the toe. Do not wear sandals. If you use a stepladder: Make sure that it is fully opened. Do not climb a closed stepladder. Make sure that both sides of the stepladder are locked into place. Ask someone to hold it for you, if possible. Clearly mark and make sure that you can see: Any grab bars or handrails. First and last steps. Where the edge of each step is. Use tools that help you move around (mobility aids) if they are needed. These include: Canes. Walkers. Scooters. Crutches. Turn on the lights when you go into a dark area. Replace any light bulbs as soon as they burn out. Set up your furniture so you have a clear path. Avoid moving your furniture around. If any of your floors are uneven, fix them. If there are any pets around you, be aware of where they are. Review your medicines with your doctor. Some medicines can make you feel dizzy. This can increase your chance of falling. Ask your doctor what other things that you can do to help prevent falls. This information is not intended to replace advice given to you by your health care provider. Make sure you discuss any questions you have with your health care provider. Document Released: 01/14/2009 Document Revised: 08/26/2015 Document Reviewed: 04/24/2014 Elsevier Interactive Patient Education  2017 [employer].

## 2022-05-16 NOTE — Telephone Encounter (Signed)
Patient seen for awv and is requesting refill of Viagra.

## 2022-07-12 DIAGNOSIS — K080 Exfoliation of teeth due to systemic causes: Secondary | ICD-10-CM | POA: Diagnosis not present

## 2022-07-19 DIAGNOSIS — K080 Exfoliation of teeth due to systemic causes: Secondary | ICD-10-CM | POA: Diagnosis not present

## 2022-07-26 ENCOUNTER — Other Ambulatory Visit: Payer: Self-pay | Admitting: Internal Medicine

## 2022-08-09 ENCOUNTER — Telehealth: Payer: Self-pay

## 2022-08-09 ENCOUNTER — Other Ambulatory Visit: Payer: Self-pay

## 2022-08-09 ENCOUNTER — Other Ambulatory Visit: Payer: Self-pay | Admitting: Internal Medicine

## 2022-08-09 MED ORDER — VALSARTAN-HYDROCHLOROTHIAZIDE 160-12.5 MG PO TABS: 1.0000 | ORAL_TABLET | Freq: Every day | ORAL | 2 refills | Status: DC

## 2022-08-09 MED ORDER — VALSARTAN-HYDROCHLOROTHIAZIDE 160-12.5 MG PO TABS: 1.0000 | ORAL_TABLET | Freq: Every day | ORAL | 0 refills | Status: DC

## 2022-08-09 NOTE — Telephone Encounter (Signed)
Need to make an 6 month f/u and lab

## 2022-08-10 NOTE — Telephone Encounter (Signed)
LVM for pt to call and make 39month f/u w/FNP

## 2022-08-11 ENCOUNTER — Other Ambulatory Visit: Payer: Self-pay | Admitting: Family Medicine

## 2022-08-16 ENCOUNTER — Ambulatory Visit (INDEPENDENT_AMBULATORY_CARE_PROVIDER_SITE_OTHER): Payer: Medicare Other | Admitting: Family Medicine

## 2022-08-16 ENCOUNTER — Encounter: Payer: Self-pay | Admitting: Family Medicine

## 2022-08-16 VITALS — BP 115/80 | HR 78 | Temp 98.0°F | Ht 73.0 in | Wt 181.0 lb

## 2022-08-16 DIAGNOSIS — I10 Essential (primary) hypertension: Secondary | ICD-10-CM | POA: Diagnosis not present

## 2022-08-16 NOTE — Assessment & Plan Note (Signed)
Chronic well controlled with goal <130/80. 115/80 today in office. Continue Amlodipine 10mg  daily, Diovan-HCT 160-12.5mg  daily. Currently taking Lipitor 40mg  daily. Not taking his Aspirin, counseled on medication compliance. Non-fasting labs today. Encouraged heart healthy diet and 150 minutes moderate intensity exercise weekly. Instructed to seek medical care for chest pain, palpitations, shortness of breath, lightheadedness/dizziness, recurrent headaches, vision changes, or swelling of extremities.

## 2022-08-16 NOTE — Progress Notes (Signed)
Subjective:  HPI: Robert Nixon is a 77 y.o. male presenting on 08/16/2022 for Follow-up (Med f/u)  HYPERTENSION without Chronic Kidney Disease Hypertension status: controlled  Satisfied with current treatment? yes Duration of hypertension: chronic BP monitoring frequency:  not checking BP range:  BP medication side effects:  no Medication compliance: excellent compliance Previous BP meds:amlodipine and valsartan-HCTZ Aspirin: yes Recurrent headaches: no Visual changes: no Palpitations: no Dyspnea: no Chest pain: no Lower extremity edema: no Dizzy/lightheaded: no  The 10-year ASCVD risk score (Arnett DK, et al., 2019) is: 27.4%   Values used to calculate the score:     Age: 54 years     Sex: Male     Is Non-Hispanic African American: Yes     Diabetic: No     Tobacco smoker: Yes     Systolic Blood Pressure: 115 mmHg     Is BP treated: Yes     HDL Cholesterol: 61 mg/dL     Total Cholesterol: 198 mg/dL   ROS: Negative unless specifically indicated above in HPI.   Relevant past medical history reviewed and updated as indicated.   Past Medical History:  Diagnosis Date   Allergy    Anxiety    Benign neoplasm of colon    Closed fracture of unspecified part of fibula    DIVERTICULOSIS OF COLON 05/20/2007   Qualifier: Diagnosis of  By: Kriste Basque MD, Lonzo Cloud    Hearing loss    no hearing aids   Hypertension    Lipoma of unspecified site    Pure hypercholesterolemia    diet controlled   Tobacco abuse      Past Surgical History:  Procedure Laterality Date   ANKLE SURGERY     right   COLONOSCOPY  03/10/2014   stark polyps   INGUINAL HERNIA REPAIR     NOSE SURGERY     POLYPECTOMY      Allergies and medications reviewed and updated.   Current Outpatient Medications:    amLODipine (NORVASC) 10 MG tablet, TAKE 1 TABLET BY MOUTH EVERY DAY AT BEDTIME, Disp: 90 tablet, Rfl: 2   aspirin 81 MG tablet, Take 81 mg by mouth daily., Disp: , Rfl:    atorvastatin  (LIPITOR) 40 MG tablet, Take 1 tablet (40 mg total) by mouth daily., Disp: 90 tablet, Rfl: 3   diclofenac Sodium (VOLTAREN) 1 % GEL, Apply 4 g topically 3 (three) times daily as needed (For shoulder pain)., Disp: 50 g, Rfl: 0   fluticasone (FLONASE) 50 MCG/ACT nasal spray, Place 2 sprays into both nostrils daily., Disp: 16 g, Rfl: 6   meclizine (ANTIVERT) 25 MG tablet, Take 1 tablet (25 mg total) by mouth 3 (three) times daily as needed for dizziness., Disp: 30 tablet, Rfl: 1   Multiple Vitamins-Minerals (MULTIVITAMIN & MINERAL PO), Take 1 tablet by mouth daily., Disp: , Rfl:    sildenafil (VIAGRA) 50 MG tablet, Take 1 tablet (50 mg total) by mouth daily as needed for erectile dysfunction., Disp: 10 tablet, Rfl: 0   valsartan-hydrochlorothiazide (DIOVAN-HCT) 160-12.5 MG tablet, TAKE 1 TABLET BY MOUTH DAILY, Disp: 90 tablet, Rfl: 1  No Known Allergies  Objective:   BP 115/80   Pulse 78   Temp 98 F (36.7 C) (Oral)   Ht 6\' 1"  (1.854 m)   Wt 181 lb (82.1 kg)   SpO2 97%   BMI 23.88 kg/m      08/16/2022    8:12 AM 05/16/2022    2:59 PM 03/20/2022  3:29 PM  Vitals with BMI  Height 6\' 1"  6\' 1"  6\' 1"   Weight 181 lbs 180 lbs 180 lbs  BMI 23.89 23.75 23.75  Systolic 115  130  Diastolic 80  80  Pulse 78  81      Physical Exam Vitals and nursing note reviewed.  Constitutional:      Appearance: Normal appearance. He is normal weight.  HENT:     Head: Normocephalic and atraumatic.  Cardiovascular:     Rate and Rhythm: Normal rate and regular rhythm.     Pulses: Normal pulses.     Heart sounds: Normal heart sounds.  Pulmonary:     Effort: Pulmonary effort is normal.     Breath sounds: Normal breath sounds.  Skin:    General: Skin is warm and dry.     Capillary Refill: Capillary refill takes less than 2 seconds.  Neurological:     General: No focal deficit present.     Mental Status: He is alert and oriented to person, place, and time. Mental status is at baseline.   Psychiatric:        Mood and Affect: Mood normal.        Behavior: Behavior normal.        Thought Content: Thought content normal.        Judgment: Judgment normal.     Assessment & Plan:  Essential hypertension Assessment & Plan: Chronic well controlled with goal <130/80. 115/80 today in office. Continue Amlodipine 10mg  daily, Diovan-HCT 160-12.5mg  daily. Currently taking Lipitor 40mg  daily. Not taking his Aspirin, counseled on medication compliance. Non-fasting labs today. Encouraged heart healthy diet and 150 minutes moderate intensity exercise weekly. Instructed to seek medical care for chest pain, palpitations, shortness of breath, lightheadedness/dizziness, recurrent headaches, vision changes, or swelling of extremities.  Orders: -     COMPLETE METABOLIC PANEL WITH GFR -     LDL cholesterol, direct     Follow up plan: No follow-ups on file.  Park Meo, FNP

## 2022-08-17 LAB — COMPLETE METABOLIC PANEL WITH GFR
AG Ratio: 1.4 (calc) (ref 1.0–2.5)
ALT: 13 U/L (ref 9–46)
AST: 16 U/L (ref 10–35)
Albumin: 4.2 g/dL (ref 3.6–5.1)
Alkaline phosphatase (APISO): 76 U/L (ref 35–144)
BUN: 14 mg/dL (ref 7–25)
CO2: 27 mmol/L (ref 20–32)
Calcium: 9.7 mg/dL (ref 8.6–10.3)
Chloride: 103 mmol/L (ref 98–110)
Creat: 1.16 mg/dL (ref 0.70–1.28)
Globulin: 2.9 g/dL (calc) (ref 1.9–3.7)
Glucose, Bld: 117 mg/dL — ABNORMAL HIGH (ref 65–99)
Potassium: 4.3 mmol/L (ref 3.5–5.3)
Sodium: 140 mmol/L (ref 135–146)
Total Bilirubin: 0.6 mg/dL (ref 0.2–1.2)
Total Protein: 7.1 g/dL (ref 6.1–8.1)
eGFR: 66 mL/min/{1.73_m2} (ref >=60)

## 2022-08-17 LAB — LDL CHOLESTEROL, DIRECT: Direct LDL: 126 mg/dL — ABNORMAL HIGH (ref <100)

## 2022-08-22 ENCOUNTER — Other Ambulatory Visit: Payer: Self-pay

## 2022-08-22 MED ORDER — ATORVASTATIN CALCIUM 80 MG PO TABS: 80.0000 mg | ORAL_TABLET | Freq: Every day | ORAL | 3 refills | Status: DC

## 2022-08-24 DIAGNOSIS — K080 Exfoliation of teeth due to systemic causes: Secondary | ICD-10-CM | POA: Diagnosis not present

## 2022-09-18 ENCOUNTER — Telehealth: Payer: Self-pay

## 2022-09-18 NOTE — Telephone Encounter (Signed)
Pt's wife called in to ask if NP would call in a med for pt having a gout flare up please. Please advise. \ Cb#: [phone]

## 2022-09-19 ENCOUNTER — Other Ambulatory Visit: Payer: Self-pay | Admitting: Family Medicine

## 2022-09-19 DIAGNOSIS — M109 Gout, unspecified: Secondary | ICD-10-CM

## 2022-09-19 MED ORDER — COLCHICINE 0.6 MG PO TABS: ORAL_TABLET | ORAL | 0 refills | Status: DC

## 2022-09-19 NOTE — Telephone Encounter (Signed)
Called and spoke w/pt's wife and per wife, would like to know if there is anything you can recommend for him to take OTC until pt's appt on 10/02/22 at 11:15.  FYI: no sooner appt available until then  Pls advice

## 2022-09-19 NOTE — Telephone Encounter (Signed)
Called and spoke w/pt's wife, will pt know that FNP is going to send medication for his gout to his pharmacy.   Wife is aware and nothing else.

## 2022-09-19 NOTE — Progress Notes (Signed)
Contacted patient regarding his symptoms. He is experiencing pain to his left toe that is consistent with symptoms 2 years ago that were relieved with Colchicine when he was diagnosed with gout initially. Encourages to avoid alcohol, red meat, shellfish, and sweetened beverages.

## 2022-10-02 ENCOUNTER — Ambulatory Visit (INDEPENDENT_AMBULATORY_CARE_PROVIDER_SITE_OTHER): Payer: Medicare Other | Admitting: Family Medicine

## 2022-10-02 VITALS — BP 130/82 | HR 80 | Temp 98.1°F | Wt 175.0 lb

## 2022-10-02 DIAGNOSIS — M109 Gout, unspecified: Secondary | ICD-10-CM

## 2022-10-02 MED ORDER — PREDNISONE 20 MG PO TABS: ORAL_TABLET | ORAL | 0 refills | Status: AC

## 2022-10-02 NOTE — Progress Notes (Signed)
Subjective:    Patient ID: Robert Nixon, male    DOB: 1946/03/02, 77 y.o.   MRN: [id number]  HPI Patient is a 77 year old African-American gentleman with history of gout.  Recently had a gout flare affecting his left first MTP joint.  My partner started him on colchicine.  He was taking 2 tablets each day and then 1 tablet in 1 hour if the pain persisted.  He tried it several times last week.  The pain is better but he continues to have pain and swelling in the left first MTP joint.  There is no significant warmth or erythema.  He does have pain with passive range of motion however he is now able to bear weight on his foot.  He denies any fevers or chills. Past Medical History:  Diagnosis Date   Allergy    Anxiety    Benign neoplasm of colon    Closed fracture of unspecified part of fibula    DIVERTICULOSIS OF COLON 05/20/2007   Qualifier: Diagnosis of  By: Kriste Basque MD, Lonzo Cloud    Hearing loss    no hearing aids   Hypertension    Lipoma of unspecified site    Pure hypercholesterolemia    diet controlled   Tobacco abuse    Past Surgical History:  Procedure Laterality Date   ANKLE SURGERY     right   COLONOSCOPY  03/10/2014   stark polyps   INGUINAL HERNIA REPAIR     NOSE SURGERY     POLYPECTOMY     Current Outpatient Medications on File Prior to Visit  Medication Sig Dispense Refill   amLODipine (NORVASC) 10 MG tablet TAKE 1 TABLET BY MOUTH EVERY DAY AT BEDTIME 90 tablet 2   aspirin 81 MG tablet Take 81 mg by mouth daily.     atorvastatin (LIPITOR) 80 MG tablet Take 1 tablet (80 mg total) by mouth daily. 90 tablet 3   colchicine 0.6 MG tablet Take 2 tablets now and 1 tablet in 1 hour if symptoms persist. Repeat in 24 hours if symptoms not relieved. 15 tablet 0   diclofenac Sodium (VOLTAREN) 1 % GEL Apply 4 g topically 3 (three) times daily as needed (For shoulder pain). 50 g 0   fluticasone (FLONASE) 50 MCG/ACT nasal spray Place 2 sprays into both nostrils daily. 16 g 6    meclizine (ANTIVERT) 25 MG tablet Take 1 tablet (25 mg total) by mouth 3 (three) times daily as needed for dizziness. 30 tablet 1   Multiple Vitamins-Minerals (MULTIVITAMIN & MINERAL PO) Take 1 tablet by mouth daily.     sildenafil (VIAGRA) 50 MG tablet Take 1 tablet (50 mg total) by mouth daily as needed for erectile dysfunction. 10 tablet 0   valsartan-hydrochlorothiazide (DIOVAN-HCT) 160-12.5 MG tablet TAKE 1 TABLET BY MOUTH DAILY 90 tablet 1   No current facility-administered medications on file prior to visit.   No Known Allergies Social History   Socioeconomic History   Marital status: Married    Spouse name: [name]   Number of children: Not on file   Years of education: Not on file   Highest education level: 12th grade  Occupational History   Not on file  Tobacco Use   Smoking status: Every Day    Packs/day: 1.00    Years: 15.00    Additional pack years: 0.00    Total pack years: 15.00    Types: Cigarettes   Smokeless tobacco: Never  Vaping Use   Vaping Use:  Never used  Substance and Sexual Activity   Alcohol use: Not Currently    Alcohol/week: 4.0 standard drinks of alcohol    Types: 4 Shots of liquor per week    Comment: Drinks brandy at night - 4 x week, quit drinking 05/2019   Drug use: No   Sexual activity: Yes  Other Topics Concern   Not on file  Social History Narrative   Still works part time    Married. 3 kids. 5 grandkids.       Retired Brewing technologist tobacco.       Hobbies: ride motorcycle   Social Determinants of Health   Financial Resource Strain: Low Risk  (08/15/2022)   Overall Financial Resource Strain (CARDIA)    Difficulty of Paying Living Expenses: Not hard at all  Food Insecurity: No Food Insecurity (08/15/2022)   Hunger Vital Sign    Worried About Running Out of Food in the Last Year: Never true    Ran Out of Food in the Last Year: Never true  Transportation Needs: No Transportation Needs (08/15/2022)   PRAPARE - Scientist, research (physical sciences) (Medical): No    Lack of Transportation (Non-Medical): No  Physical Activity: Sufficiently Active (08/15/2022)   Exercise Vital Sign    Days of Exercise per Week: 4 days    Minutes of Exercise per Session: 100 min  Stress: No Stress Concern Present (08/15/2022)   [employer] of Occupational Health - Occupational Stress Questionnaire    Feeling of Stress : Only a little  Social Connections: Socially Integrated (08/15/2022)   Social Connection and Isolation Panel [NHANES]    Frequency of Communication with Friends and Family: Three times a week    Frequency of Social Gatherings with Friends and Family: Once a week    Attends Religious Services: More than 4 times per year    Active Member of [employer] or Organizations: Yes    Attends Engineer, structural: More than 4 times per year    Marital Status: Married  Catering manager Violence: Not At Risk (05/16/2022)   Humiliation, Afraid, Rape, and Kick questionnaire    Fear of Current or Ex-Partner: No    Emotionally Abused: No    Physically Abused: No    Sexually Abused: No      Review of Systems  All other systems reviewed and are negative.      Objective:   Physical Exam Vitals reviewed.  Constitutional:      Appearance: Normal appearance.  Cardiovascular:     Rate and Rhythm: Normal rate and regular rhythm.     Pulses: Normal pulses.          Dorsalis pedis pulses are 2+ on the right side and 2+ on the left side.       Posterior tibial pulses are 2+ on the right side and 2+ on the left side.     Heart sounds: Normal heart sounds.  Musculoskeletal:     Right foot: Normal range of motion.     Left foot: Normal range of motion.       Feet:  Feet:     Right foot:     Skin integrity: No ulcer, blister, skin breakdown, erythema or warmth.     Left foot:     Skin integrity: Warmth present. No ulcer, blister, skin breakdown or erythema.  Neurological:     Mental Status: He is alert.            Assessment &  Plan:   Acute gout involving toe of left foot, unspecified cause Begin a prednisone taper pack.  Spent a great deal of time discussing the natural history of gout.  Discussed preventative strategies including diet as well as taking allopurinol.  Patient at the present time simply wants to treat the attack but if recurrent he may consider preventative therapy

## 2022-11-02 DIAGNOSIS — K080 Exfoliation of teeth due to systemic causes: Secondary | ICD-10-CM | POA: Diagnosis not present

## 2022-11-20 ENCOUNTER — Telehealth: Payer: Self-pay | Admitting: Family Medicine

## 2022-11-20 MED ORDER — COLCHICINE 0.6 MG PO TABS: ORAL_TABLET | ORAL | 0 refills | Status: AC

## 2022-11-20 NOTE — Telephone Encounter (Signed)
Patient called to request a refill of the medication prescribed for gout; patient unsure of name.  Pharmacy confirmed as   [id number] - [place], [place] - [address], [place] [address] Phone: [phone]  Fax: [phone] DEA #: [id number]   Please advise at [phone]

## 2023-01-30 ENCOUNTER — Other Ambulatory Visit: Payer: Self-pay | Admitting: Internal Medicine

## 2023-01-30 DIAGNOSIS — I10 Essential (primary) hypertension: Secondary | ICD-10-CM

## 2023-02-15 ENCOUNTER — Encounter: Payer: Self-pay | Admitting: Family Medicine

## 2023-02-15 ENCOUNTER — Other Ambulatory Visit: Payer: Self-pay

## 2023-02-15 ENCOUNTER — Ambulatory Visit (INDEPENDENT_AMBULATORY_CARE_PROVIDER_SITE_OTHER): Payer: Medicare Other | Admitting: Family Medicine

## 2023-02-15 VITALS — BP 130/76 | HR 84 | Temp 98.3°F | Ht 73.0 in | Wt 176.0 lb

## 2023-02-15 DIAGNOSIS — Z0001 Encounter for general adult medical examination with abnormal findings: Secondary | ICD-10-CM

## 2023-02-15 DIAGNOSIS — E782 Mixed hyperlipidemia: Secondary | ICD-10-CM

## 2023-02-15 DIAGNOSIS — F17210 Nicotine dependence, cigarettes, uncomplicated: Secondary | ICD-10-CM

## 2023-02-15 DIAGNOSIS — I10 Essential (primary) hypertension: Secondary | ICD-10-CM | POA: Diagnosis not present

## 2023-02-15 DIAGNOSIS — Z23 Encounter for immunization: Secondary | ICD-10-CM | POA: Diagnosis not present

## 2023-02-15 DIAGNOSIS — Z0000 Encounter for general adult medical examination without abnormal findings: Secondary | ICD-10-CM | POA: Insufficient documentation

## 2023-02-15 MED ORDER — AMLODIPINE BESYLATE 10 MG PO TABS: ORAL_TABLET | ORAL | 2 refills | Status: DC

## 2023-02-15 MED ORDER — ATORVASTATIN CALCIUM 80 MG PO TABS: 80.0000 mg | ORAL_TABLET | Freq: Every day | ORAL | 3 refills | Status: AC

## 2023-02-15 NOTE — Assessment & Plan Note (Signed)

## 2023-02-15 NOTE — Assessment & Plan Note (Signed)
 3-5 minute discussion regarding the harms of tobacco use, the benefits of cessation, and methods of cessation. Discussed that there are medication options to help with cessation. Provided printed education on steps to quit smoking. Patient is not ready to try a medication to help.

## 2023-02-15 NOTE — Progress Notes (Signed)
Subjective:  HPI: Robert Nixon is a 77 y.o. male presenting on 02/15/2023 for Follow-up ( 6 month follow up (around 02/16/23) - [facility]\\\/)   HPI  Patient is in today for follow-up for chronic conditions. No new concerns. He eats a regular diet and does not have an exercise routine but does still work. He will get a shingles and flu vaccine today.  He is a current PPD smoker and has smoked on and off for >20 years cumulatively, is not ready to quit.   HYPERTENSION / HYPERLIPIDEMIA Satisfied with current treatment? yes Duration of hypertension: chronic BP monitoring frequency: not checking BP range:  BP medication side effects: no Past BP meds: amlodipine and valsartan-HCTZ Duration of hyperlipidemia: chronic Cholesterol medication side effects: no Cholesterol supplements: none Past cholesterol medications: atorvastain (lipitor) Medication compliance: excellent compliance Aspirin: no Recent stressors: no Recurrent headaches: no Visual changes: no Palpitations: no Dyspnea: no Chest pain: no Lower extremity edema: no Dizzy/lightheaded: no  Colon CA screening: Due 2026 Vaccines:  Flu and Shingles today Tobacco: 1ppd >20 years   Review of Systems  Constitutional: Negative.   HENT:  Positive for hearing loss.   Eyes: Negative.   Respiratory: Negative.    Cardiovascular: Negative.   Gastrointestinal: Negative.   Genitourinary: Negative.   Musculoskeletal: Negative.   Skin: Negative.   Neurological: Negative.   Endo/Heme/Allergies: Negative.   Psychiatric/Behavioral: Negative.    All other systems reviewed and are negative.   Relevant past medical history reviewed and updated as indicated.   Past Medical History:  Diagnosis Date   Allergy    Anxiety    Benign neoplasm of colon    Closed fracture of unspecified part of fibula    DIVERTICULOSIS OF COLON 05/20/2007   Qualifier: Diagnosis of  By: Kriste Basque MD, Lonzo Cloud    Hearing loss    no hearing aids    Hypertension    Lipoma of unspecified site    Pure hypercholesterolemia    diet controlled   Tobacco abuse      Past Surgical History:  Procedure Laterality Date   ANKLE SURGERY     right   COLONOSCOPY  03/10/2014   stark polyps   INGUINAL HERNIA REPAIR     NOSE SURGERY     POLYPECTOMY      Allergies and medications reviewed and updated.   Current Outpatient Medications:    aspirin 81 MG tablet, Take 81 mg by mouth daily., Disp: , Rfl:    colchicine 0.6 MG tablet, Take 2 tablets now and 1 tablet in 1 hour if symptoms persist. Repeat in 24 hours if symptoms not relieved., Disp: 15 tablet, Rfl: 0   diclofenac Sodium (VOLTAREN) 1 % GEL, Apply 4 g topically 3 (three) times daily as needed (For shoulder pain)., Disp: 50 g, Rfl: 0   fluticasone (FLONASE) 50 MCG/ACT nasal spray, Place 2 sprays into both nostrils daily., Disp: 16 g, Rfl: 6   meclizine (ANTIVERT) 25 MG tablet, Take 1 tablet (25 mg total) by mouth 3 (three) times daily as needed for dizziness., Disp: 30 tablet, Rfl: 1   Multiple Vitamins-Minerals (MULTIVITAMIN & MINERAL PO), Take 1 tablet by mouth daily., Disp: , Rfl:    sildenafil (VIAGRA) 50 MG tablet, Take 1 tablet (50 mg total) by mouth daily as needed for erectile dysfunction., Disp: 10 tablet, Rfl: 0   valsartan-hydrochlorothiazide (DIOVAN-HCT) 160-12.5 MG tablet, TAKE 1 TABLET BY MOUTH DAILY, Disp: 90 tablet, Rfl: 1   amLODipine (NORVASC) 10 MG  tablet, TAKE 1 TABLET BY MOUTH EVERY DAY AT BEDTIME, Disp: 90 tablet, Rfl: 2   atorvastatin (LIPITOR) 80 MG tablet, Take 1 tablet (80 mg total) by mouth daily., Disp: 90 tablet, Rfl: 3   predniSONE (DELTASONE) 20 MG tablet, 3 tabs poqday 1-2, 2 tabs poqday 3-4, 1 tab poqday 5-6 (Patient not taking: Reported on 02/15/2023), Disp: 12 tablet, Rfl: 0  No Known Allergies  Objective:   BP 130/76   Pulse 84   Temp 98.3 F (36.8 C) (Oral)   Ht 6\' 1"  (1.854 m)   Wt 176 lb (79.8 kg)   SpO2 92%   BMI 23.22 kg/m       02/15/2023    2:49 PM 10/02/2022    4:16 PM 08/16/2022    8:12 AM  Vitals with BMI  Height 6\' 1"   6\' 1"   Weight 176 lbs 175 lbs 181 lbs  BMI 23.23  23.89  Systolic 130 130 536  Diastolic 76 82 80  Pulse 84 80 78     Physical Exam Vitals and nursing note reviewed.  Constitutional:      Appearance: Normal appearance. He is normal weight.  HENT:     Head: Normocephalic and atraumatic.     Right Ear: Tympanic membrane, ear canal and external ear normal.     Left Ear: Tympanic membrane, ear canal and external ear normal.     Nose: Nose normal.     Mouth/Throat:     Mouth: Mucous membranes are moist.     Pharynx: Oropharynx is clear.  Eyes:     Extraocular Movements: Extraocular movements intact.     Right eye: Normal extraocular motion and no nystagmus.     Left eye: Normal extraocular motion and no nystagmus.     Conjunctiva/sclera: Conjunctivae normal.     Pupils: Pupils are equal, round, and reactive to light.  Cardiovascular:     Rate and Rhythm: Normal rate and regular rhythm.     Pulses: Normal pulses.     Heart sounds: Normal heart sounds.  Pulmonary:     Effort: Pulmonary effort is normal.     Breath sounds: Normal breath sounds.  Abdominal:     General: Bowel sounds are normal.     Palpations: Abdomen is soft.  Genitourinary:    Comments: Deferred using shared decision making Musculoskeletal:        General: Normal range of motion.     Cervical back: Normal range of motion and neck supple.  Skin:    General: Skin is warm and dry.     Capillary Refill: Capillary refill takes less than 2 seconds.  Neurological:     General: No focal deficit present.     Mental Status: He is alert. Mental status is at baseline.  Psychiatric:        Mood and Affect: Mood normal.        Speech: Speech normal.        Behavior: Behavior normal.        Thought Content: Thought content normal.        Cognition and Memory: Cognition and memory normal.        Judgment: Judgment  normal.     Assessment & Plan:  Mixed hyperlipidemia Assessment & Plan: Continue Atorvastatin 80mg  daily. Scheduled for fasting labs. I recommend consuming a heart healthy diet such as Mediterranean diet or DASH diet with whole grains, fruits, vegetable, fish, lean meats, nuts, and olive oil. Limit sweets and processed foods. I  also encourage moderate intensity exercise 150 minutes weekly. This is 3-5 times weekly for 30-50 minutes each session. Goal should be pace of 3 miles/hours, or walking 1.5 miles in 30 minutes.  Orders: -     Lipid panel  Essential hypertension Assessment & Plan: Chronic well controlled with goal <130/80. Unclear if he is taking his Amlodipine. Continue Amlodipine 10mg  daily, Diovan-HCT 160-12.5mg  daily. Currently taking Lipitor 40mg  daily. Recommend heart healthy diet such as Mediterranean diet with whole grains, fruits, vegetable, fish, lean meats, nuts, and olive oil. Limit salt. Encouraged moderate walking, 3-5 times/week for 30-50 minutes each session. Aim for at least 150 minutes.week. Goal should be pace of 3 miles/hours, or walking 1.5 miles in 30 minutes. Avoid tobacco products. Avoid excess alcohol. Take medications as prescribed and bring medications and blood pressure log with cuff to each office visit. Seek medical care for chest pain, palpitations, shortness of breath with exertion, dizziness/lightheadedness, vision changes, recurrent headaches, or swelling of extremities.   Orders: -     CBC with Differential/Platelet -     COMPLETE METABOLIC PANEL WITH GFR -     Lipid panel -     Hemoglobin A1c -     amLODIPine Besylate; TAKE 1 TABLET BY MOUTH EVERY DAY AT BEDTIME  Dispense: 90 tablet; Refill: 2  Physical exam, annual Assessment & Plan: Today your medical history was reviewed and routine physical exam with labs was performed. Recommend 150 minutes of moderate intensity exercise weekly and consuming a well-balanced diet. Advised to stop smoking if a  smoker, avoid smoking if a non-smoker, limit alcohol consumption to 1 drink per day for women and 2 drinks per day for men, and avoid illicit drug use. Counseled in mental health awareness and when to seek medical care. Vaccine maintenance discussed. Appropriate health maintenance items reviewed. Return to office in 1 year for annual physical exam.    Cigarette nicotine dependence without complication Assessment & Plan: 3-5 minute discussion regarding the harms of tobacco use, the benefits of cessation, and methods of cessation. Discussed that there are medication options to help with cessation. Provided printed education on steps to quit smoking. Patient is not ready to try a medication to help.   Orders: -     Ambulatory Referral for Lung Cancer Scre -     [facility] [employer]ICARE SCREENING; Future  Other orders -     Atorvastatin Calcium; Take 1 tablet (80 mg total) by mouth daily.  Dispense: 90 tablet; Refill: 3     Follow up plan: Return in about 3 months (around 05/18/2023) for AWV and labs next wednesday.  Park Meo, FNP

## 2023-02-15 NOTE — Addendum Note (Signed)
Addended by: Arta Silence on: 02/15/2023 04:25 PM   Modules accepted: Orders

## 2023-02-15 NOTE — Assessment & Plan Note (Addendum)
Continue Atorvastatin 80mg  daily. Scheduled for fasting labs. I recommend consuming a heart healthy diet such as Mediterranean diet or DASH diet with whole grains, fruits, vegetable, fish, lean meats, nuts, and olive oil. Limit sweets and processed foods. I also encourage moderate intensity exercise 150 minutes weekly. This is 3-5 times weekly for 30-50 minutes each session. Goal should be pace of 3 miles/hours, or walking 1.5 miles in 30 minutes.

## 2023-02-15 NOTE — Assessment & Plan Note (Signed)
Chronic well controlled with goal <130/80. Unclear if he is taking his Amlodipine. Continue Amlodipine 10mg  daily, Diovan-HCT 160-12.5mg  daily. Currently taking Lipitor 40mg  daily. Recommend heart healthy diet such as Mediterranean diet with whole grains, fruits, vegetable, fish, lean meats, nuts, and olive oil. Limit salt. Encouraged moderate walking, 3-5 times/week for 30-50 minutes each session. Aim for at least 150 minutes.week. Goal should be pace of 3 miles/hours, or walking 1.5 miles in 30 minutes. Avoid tobacco products. Avoid excess alcohol. Take medications as prescribed and bring medications and blood pressure log with cuff to each office visit. Seek medical care for chest pain, palpitations, shortness of breath with exertion, dizziness/lightheadedness, vision changes, recurrent headaches, or swelling of extremities.

## 2023-02-21 ENCOUNTER — Other Ambulatory Visit: Payer: Medicare Other

## 2023-02-21 DIAGNOSIS — E782 Mixed hyperlipidemia: Secondary | ICD-10-CM | POA: Diagnosis not present

## 2023-02-21 DIAGNOSIS — I10 Essential (primary) hypertension: Secondary | ICD-10-CM | POA: Diagnosis not present

## 2023-02-22 LAB — CBC WITH DIFFERENTIAL/PLATELET
Absolute Lymphocytes: 1716 {cells}/uL (ref 850–3900)
Absolute Monocytes: 523 {cells}/uL (ref 200–950)
Basophils Absolute: 39 {cells}/uL (ref 0–200)
Basophils Relative: 0.7 %
Eosinophils Absolute: 440 {cells}/uL (ref 15–500)
Eosinophils Relative: 8 %
HCT: 42.3 % (ref 38.5–50.0)
Hemoglobin: 13.8 g/dL (ref 13.2–17.1)
MCH: 29.9 pg (ref 27.0–33.0)
MCHC: 32.6 g/dL (ref 32.0–36.0)
MCV: 91.8 fL (ref 80.0–100.0)
MPV: 10.8 fL (ref 7.5–12.5)
Monocytes Relative: 9.5 %
Neutro Abs: 2783 {cells}/uL (ref 1500–7800)
Neutrophils Relative %: 50.6 %
Platelets: 264 10*3/uL (ref 140–400)
RBC: 4.61 10*6/uL (ref 4.20–5.80)
RDW: 12.1 % (ref 11.0–15.0)
Total Lymphocyte: 31.2 %
WBC: 5.5 10*3/uL (ref 3.8–10.8)

## 2023-02-22 LAB — COMPLETE METABOLIC PANEL WITH GFR
AG Ratio: 1.7 (calc) (ref 1.0–2.5)
ALT: 37 U/L (ref 9–46)
AST: 34 U/L (ref 10–35)
Albumin: 4.3 g/dL (ref 3.6–5.1)
Alkaline phosphatase (APISO): 106 U/L (ref 35–144)
BUN: 15 mg/dL (ref 7–25)
CO2: 30 mmol/L (ref 20–32)
Calcium: 9.9 mg/dL (ref 8.6–10.3)
Chloride: 102 mmol/L (ref 98–110)
Creat: 1.22 mg/dL (ref 0.70–1.28)
Globulin: 2.6 g/dL (ref 1.9–3.7)
Glucose, Bld: 104 mg/dL — ABNORMAL HIGH (ref 65–99)
Potassium: 4.1 mmol/L (ref 3.5–5.3)
Sodium: 141 mmol/L (ref 135–146)
Total Bilirubin: 0.8 mg/dL (ref 0.2–1.2)
Total Protein: 6.9 g/dL (ref 6.1–8.1)
eGFR: 62 mL/min/{1.73_m2} (ref >=60)

## 2023-02-22 LAB — LIPID PANEL
Cholesterol: 166 mg/dL (ref <200)
HDL: 65 mg/dL (ref >=40)
LDL Cholesterol (Calc): 83 mg/dL
Non-HDL Cholesterol (Calc): 101 mg/dL (ref <130)
Total CHOL/HDL Ratio: 2.6 (calc) (ref <5.0)
Triglycerides: 89 mg/dL (ref <150)

## 2023-02-22 LAB — HEMOGLOBIN A1C
Hgb A1c MFr Bld: 5.2 %{Hb} (ref <5.7)
Mean Plasma Glucose: 103 mg/dL
eAG (mmol/L): 5.7 mmol/L

## 2023-02-27 ENCOUNTER — Ambulatory Visit (HOSPITAL_BASED_OUTPATIENT_CLINIC_OR_DEPARTMENT_OTHER)
Admission: RE | Admit: 2023-02-27 | Discharge: 2023-02-27 | Disposition: A | Discharge status: Home / Self Care | Payer: Medicare Other | Source: Ambulatory Visit | Attending: Family Medicine | Admitting: Family Medicine

## 2023-02-27 DIAGNOSIS — F17210 Nicotine dependence, cigarettes, uncomplicated: Secondary | ICD-10-CM | POA: Insufficient documentation

## 2023-02-27 DIAGNOSIS — Z136 Encounter for screening for cardiovascular disorders: Secondary | ICD-10-CM | POA: Insufficient documentation

## 2023-03-27 ENCOUNTER — Emergency Department (HOSPITAL_COMMUNITY)
Admission: EM | Admit: 2023-03-27 | Discharge: 2023-03-28 | Disposition: A | Discharge status: Home / Self Care | Payer: Medicare Other | Attending: Emergency Medicine | Admitting: Emergency Medicine

## 2023-03-27 DIAGNOSIS — N179 Acute kidney failure, unspecified: Secondary | ICD-10-CM | POA: Insufficient documentation

## 2023-03-27 DIAGNOSIS — I10 Essential (primary) hypertension: Secondary | ICD-10-CM | POA: Diagnosis not present

## 2023-03-27 DIAGNOSIS — S0990XA Unspecified injury of head, initial encounter: Secondary | ICD-10-CM | POA: Diagnosis not present

## 2023-03-27 DIAGNOSIS — Y907 Blood alcohol level of 200-239 mg/100 ml: Secondary | ICD-10-CM | POA: Diagnosis not present

## 2023-03-27 DIAGNOSIS — F17200 Nicotine dependence, unspecified, uncomplicated: Secondary | ICD-10-CM | POA: Diagnosis not present

## 2023-03-27 DIAGNOSIS — Y92009 Unspecified place in unspecified non-institutional (private) residence as the place of occurrence of the external cause: Secondary | ICD-10-CM | POA: Insufficient documentation

## 2023-03-27 DIAGNOSIS — R55 Syncope and collapse: Secondary | ICD-10-CM | POA: Insufficient documentation

## 2023-03-27 DIAGNOSIS — W19XXXA Unspecified fall, initial encounter: Secondary | ICD-10-CM

## 2023-03-27 DIAGNOSIS — W01198A Fall on same level from slipping, tripping and stumbling with subsequent striking against other object, initial encounter: Secondary | ICD-10-CM | POA: Diagnosis not present

## 2023-03-27 DIAGNOSIS — F10920 Alcohol use, unspecified with intoxication, uncomplicated: Secondary | ICD-10-CM | POA: Diagnosis not present

## 2023-03-27 DIAGNOSIS — F10129 Alcohol abuse with intoxication, unspecified: Secondary | ICD-10-CM | POA: Diagnosis not present

## 2023-03-27 DIAGNOSIS — Z7982 Long term (current) use of aspirin: Secondary | ICD-10-CM | POA: Insufficient documentation

## 2023-03-27 DIAGNOSIS — Z79899 Other long term (current) drug therapy: Secondary | ICD-10-CM | POA: Diagnosis not present

## 2023-03-27 DIAGNOSIS — S199XXA Unspecified injury of neck, initial encounter: Secondary | ICD-10-CM | POA: Diagnosis not present

## 2023-03-27 DIAGNOSIS — R410 Disorientation, unspecified: Secondary | ICD-10-CM | POA: Diagnosis not present

## 2023-03-27 NOTE — ED Triage Notes (Addendum)
Pt BIB EMS from home with c/o syncope. ETOH on board. A&O, but asks why he is here? C-Collar in place

## 2023-03-28 ENCOUNTER — Emergency Department (HOSPITAL_COMMUNITY): Payer: Medicare Other

## 2023-03-28 ENCOUNTER — Other Ambulatory Visit: Payer: Self-pay

## 2023-03-28 ENCOUNTER — Encounter (HOSPITAL_COMMUNITY): Payer: Self-pay | Admitting: Emergency Medicine

## 2023-03-28 DIAGNOSIS — S199XXA Unspecified injury of neck, initial encounter: Secondary | ICD-10-CM | POA: Diagnosis not present

## 2023-03-28 DIAGNOSIS — S0990XA Unspecified injury of head, initial encounter: Secondary | ICD-10-CM | POA: Diagnosis not present

## 2023-03-28 LAB — I-STAT CHEM 8, ED
BUN: 20 mg/dL (ref 8–23)
Calcium, Ion: 1.03 mmol/L — ABNORMAL LOW (ref 1.15–1.40)
Chloride: 109 mmol/L (ref 98–111)
Creatinine, Ser: 1.8 mg/dL — ABNORMAL HIGH (ref 0.61–1.24)
Glucose, Bld: 108 mg/dL — ABNORMAL HIGH (ref 70–99)
HCT: 37 % — ABNORMAL LOW (ref 39.0–52.0)
Hemoglobin: 12.6 g/dL — ABNORMAL LOW (ref 13.0–17.0)
Potassium: 3.4 mmol/L — ABNORMAL LOW (ref 3.5–5.1)
Sodium: 146 mmol/L — ABNORMAL HIGH (ref 135–145)
TCO2: 24 mmol/L (ref 22–32)

## 2023-03-28 LAB — CBC WITH DIFFERENTIAL/PLATELET
Abs Immature Granulocytes: 0.02 10*3/uL (ref 0.00–0.07)
Basophils Absolute: 0 10*3/uL (ref 0.0–0.1)
Basophils Relative: 1 %
Eosinophils Absolute: 0.4 10*3/uL (ref 0.0–0.5)
Eosinophils Relative: 8 %
HCT: 32.8 % — ABNORMAL LOW (ref 39.0–52.0)
Hemoglobin: 10.9 g/dL — ABNORMAL LOW (ref 13.0–17.0)
Immature Granulocytes: 0 %
Lymphocytes Relative: 52 %
Lymphs Abs: 2.9 10*3/uL (ref 0.7–4.0)
MCH: 30.4 pg (ref 26.0–34.0)
MCHC: 33.2 g/dL (ref 30.0–36.0)
MCV: 91.6 fL (ref 80.0–100.0)
Monocytes Absolute: 0.4 10*3/uL (ref 0.1–1.0)
Monocytes Relative: 8 %
Neutro Abs: 1.7 10*3/uL (ref 1.7–7.7)
Neutrophils Relative %: 31 %
Platelets: 217 10*3/uL (ref 150–400)
RBC: 3.58 MIL/uL — ABNORMAL LOW (ref 4.22–5.81)
RDW: 13.2 % (ref 11.5–15.5)
WBC: 5.6 10*3/uL (ref 4.0–10.5)
nRBC: 0 % (ref 0.0–0.2)

## 2023-03-28 LAB — BASIC METABOLIC PANEL
Anion gap: 8 (ref 5–15)
BUN: 17 mg/dL (ref 8–23)
CO2: 23 mmol/L (ref 22–32)
Calcium: 7.6 mg/dL — ABNORMAL LOW (ref 8.9–10.3)
Chloride: 111 mmol/L (ref 98–111)
Creatinine, Ser: 1.47 mg/dL — ABNORMAL HIGH (ref 0.61–1.24)
GFR, Estimated: 49 mL/min — ABNORMAL LOW (ref >60)
Glucose, Bld: 96 mg/dL (ref 70–99)
Potassium: 5.3 mmol/L — ABNORMAL HIGH (ref 3.5–5.1)
Sodium: 142 mmol/L (ref 135–145)

## 2023-03-28 LAB — URINALYSIS, ROUTINE W REFLEX MICROSCOPIC
Bilirubin Urine: NEGATIVE
Glucose, UA: NEGATIVE mg/dL
Hgb urine dipstick: NEGATIVE
Ketones, ur: NEGATIVE mg/dL
Leukocytes,Ua: NEGATIVE
Nitrite: NEGATIVE
Protein, ur: NEGATIVE mg/dL
Specific Gravity, Urine: 1.009 (ref 1.005–1.030)
pH: 5 (ref 5.0–8.0)

## 2023-03-28 LAB — ETHANOL: Alcohol, Ethyl (B): 242 mg/dL — ABNORMAL HIGH (ref <10)

## 2023-03-28 LAB — CBG MONITORING, ED: Glucose-Capillary: 128 mg/dL — ABNORMAL HIGH (ref 70–99)

## 2023-03-28 NOTE — ED Provider Notes (Addendum)
[place] EMERGENCY DEPARTMENT AT [facility] Provider Note   CSN: [id number] Arrival date & time: 03/27/23  2358     History  Chief Complaint  Patient presents with   Loss of Consciousness    Robert Nixon is a 77 y.o. male.  Patient with past medical history significant for generalized anxiety, tobacco abuse, hypertension, hearing loss presents to the emergency department via EMS after reported syncopal episode.  Patient was reportedly drinking eggnog with alcohol this evening when he lost his balance and fell backwards hitting his head during the fall.  The patient does not remember falling and has no complaints upon arrival.  He is in a c-collar upon arrival.  Patient does endorse drinking alcohol earlier in the evening.  Family at bedside states that patient was witnessed falling and fell backwards hitting his head on the ground.   Loss of Consciousness      Home Medications Prior to Admission medications   Medication Sig Start Date End Date Taking? Authorizing Provider  amLODipine (NORVASC) 10 MG tablet TAKE 1 TABLET BY MOUTH EVERY DAY AT BEDTIME 02/15/23   Park Meo, FNP  aspirin 81 MG tablet Take 81 mg by mouth daily.    [provider]  atorvastatin (LIPITOR) 80 MG tablet Take 1 tablet (80 mg total) by mouth daily. 02/15/23   Park Meo, FNP  colchicine 0.6 MG tablet Take 2 tablets now and 1 tablet in 1 hour if symptoms persist. Repeat in 24 hours if symptoms not relieved. 11/20/22   Park Meo, FNP  diclofenac Sodium (VOLTAREN) 1 % GEL Apply 4 g topically 3 (three) times daily as needed (For shoulder pain). 01/05/21   Anabel Halon, MD  fluticasone (FLONASE) 50 MCG/ACT nasal spray Place 2 sprays into both nostrils daily. 05/24/21   Donell Beers, FNP  meclizine (ANTIVERT) 25 MG tablet Take 1 tablet (25 mg total) by mouth 3 (three) times daily as needed for dizziness. 07/29/21   Anabel Halon, MD  Multiple Vitamins-Minerals  (MULTIVITAMIN & MINERAL PO) Take 1 tablet by mouth daily.    [provider]  predniSONE (DELTASONE) 20 MG tablet 3 tabs poqday 1-2, 2 tabs poqday 3-4, 1 tab poqday 5-6 Patient not taking: Reported on 02/15/2023 10/02/22   Donita Brooks, MD  sildenafil (VIAGRA) 50 MG tablet Take 1 tablet (50 mg total) by mouth daily as needed for erectile dysfunction. 05/16/22   Park Meo, FNP  valsartan-hydrochlorothiazide (DIOVAN-HCT) 160-12.5 MG tablet TAKE 1 TABLET BY MOUTH DAILY 08/14/22   Park Meo, FNP      Allergies    Patient has no known allergies.    Review of Systems   Review of Systems  Cardiovascular:  Positive for syncope.    Physical Exam Updated Vital Signs BP 128/73   Pulse 78   Temp 97.6 F (36.4 C) (Oral)   Resp 17   Ht 6\' 1"  (1.854 m)   Wt 79.8 kg   SpO2 96%   BMI 23.21 kg/m  Physical Exam Vitals and nursing note reviewed.  Constitutional:      General: He is not in acute distress.    Appearance: He is well-developed.  HENT:     Head: Normocephalic and atraumatic.     Comments: No obvious trauma noted to patient's head Eyes:     Extraocular Movements: Extraocular movements intact.     Conjunctiva/sclera: Conjunctivae normal.     Pupils: Pupils are equal, round, and  reactive to light.  Neck:     Comments: Patient in c-collar upon arrival.  No tenderness noted to palpation of the midline cervical spine Cardiovascular:     Rate and Rhythm: Normal rate and regular rhythm.     Heart sounds: No murmur heard. Pulmonary:     Effort: Pulmonary effort is normal. No respiratory distress.     Breath sounds: Normal breath sounds.  Abdominal:     Palpations: Abdomen is soft.     Tenderness: There is no abdominal tenderness.  Musculoskeletal:        General: No swelling.     Cervical back: Neck supple.  Skin:    General: Skin is warm and dry.     Capillary Refill: Capillary refill takes less than 2 seconds.  Neurological:     Mental Status: He is  alert.  Psychiatric:        Mood and Affect: Mood normal.     ED Results / Procedures / Treatments   Labs (all labs ordered are listed, but only abnormal results are displayed) Labs Reviewed  BASIC METABOLIC PANEL - Abnormal; Notable for the following components:      Result Value   Potassium 5.3 (*)    Creatinine, Ser 1.47 (*)    Calcium 7.6 (*)    GFR, Estimated 49 (*)    All other components within normal limits  CBC WITH DIFFERENTIAL/PLATELET - Abnormal; Notable for the following components:   RBC 3.58 (*)    Hemoglobin 10.9 (*)    HCT 32.8 (*)    All other components within normal limits  ETHANOL - Abnormal; Notable for the following components:   Alcohol, Ethyl (B) 242 (*)    All other components within normal limits  CBG MONITORING, ED - Abnormal; Notable for the following components:   Glucose-Capillary 128 (*)    All other components within normal limits  I-STAT CHEM 8, ED - Abnormal; Notable for the following components:   Sodium 146 (*)    Potassium 3.4 (*)    Creatinine, Ser 1.80 (*)    Glucose, Bld 108 (*)    Calcium, Ion 1.03 (*)    Hemoglobin 12.6 (*)    HCT 37.0 (*)    All other components within normal limits  URINALYSIS, ROUTINE W REFLEX MICROSCOPIC    EKG EKG Interpretation Date/Time:  Wednesday March 28 2023 00:07:12 EST Ventricular Rate:  72 PR Interval:  178 QRS Duration:  104 QT Interval:  403 QTC Calculation: 441 R Axis:   68  Text Interpretation: Sinus rhythm Confirmed by Zadie Rhine ([id number]) on 03/28/2023 12:42:16 AM  Radiology CT HEAD WO CONTRAST Result Date: 03/28/2023 CLINICAL DATA:  Head and neck trauma, intoxicated EXAM: CT HEAD WITHOUT CONTRAST CT CERVICAL SPINE WITHOUT CONTRAST TECHNIQUE: Multidetector CT imaging of the head and cervical spine was performed following the standard protocol without intravenous contrast. Multiplanar CT image reconstructions of the cervical spine were also generated. RADIATION DOSE REDUCTION:  This exam was performed according to the departmental dose-optimization program which includes automated exposure control, adjustment of the mA and/or kV according to patient size and/or use of iterative reconstruction technique. COMPARISON:  08/25/2010 CT head and cervical spine FINDINGS: CT HEAD FINDINGS Brain: No evidence of acute infarct, hemorrhage, mass, mass effect, or midline shift. No hydrocephalus or extra-axial fluid collection. Vascular: No hyperdense vessel. Skull: Negative for fracture or focal lesion. Sinuses/Orbits: Mucosal thickening in the frontal sinuses and ethmoid air cells. No acute finding in the orbits. Other:  The mastoid air cells are well aerated. CT CERVICAL SPINE FINDINGS Alignment: No traumatic listhesis. Skull base and vertebrae: No acute fracture or suspicious osseous lesion. Osseous fusion of C2-C4, previously C2-C3. Soft tissues and spinal canal: No prevertebral fluid or swelling. No visible canal hematoma. Disc levels: Degenerative changes in the cervical spine.No high-grade spinal canal stenosis. Upper chest: No focal pulmonary opacity or pleural effusion. IMPRESSION: 1. No acute intracranial process. 2. No acute fracture or traumatic listhesis in the cervical spine. Electronically Signed   By: Wiliam Ke M.D.   On: 03/28/2023 01:14   CT CERVICAL SPINE WO CONTRAST Result Date: 03/28/2023 CLINICAL DATA:  Head and neck trauma, intoxicated EXAM: CT HEAD WITHOUT CONTRAST CT CERVICAL SPINE WITHOUT CONTRAST TECHNIQUE: Multidetector CT imaging of the head and cervical spine was performed following the standard protocol without intravenous contrast. Multiplanar CT image reconstructions of the cervical spine were also generated. RADIATION DOSE REDUCTION: This exam was performed according to the departmental dose-optimization program which includes automated exposure control, adjustment of the mA and/or kV according to patient size and/or use of iterative reconstruction technique.  COMPARISON:  08/25/2010 CT head and cervical spine FINDINGS: CT HEAD FINDINGS Brain: No evidence of acute infarct, hemorrhage, mass, mass effect, or midline shift. No hydrocephalus or extra-axial fluid collection. Vascular: No hyperdense vessel. Skull: Negative for fracture or focal lesion. Sinuses/Orbits: Mucosal thickening in the frontal sinuses and ethmoid air cells. No acute finding in the orbits. Other: The mastoid air cells are well aerated. CT CERVICAL SPINE FINDINGS Alignment: No traumatic listhesis. Skull base and vertebrae: No acute fracture or suspicious osseous lesion. Osseous fusion of C2-C4, previously C2-C3. Soft tissues and spinal canal: No prevertebral fluid or swelling. No visible canal hematoma. Disc levels: Degenerative changes in the cervical spine.No high-grade spinal canal stenosis. Upper chest: No focal pulmonary opacity or pleural effusion. IMPRESSION: 1. No acute intracranial process. 2. No acute fracture or traumatic listhesis in the cervical spine. Electronically Signed   By: Wiliam Ke M.D.   On: 03/28/2023 01:14    Procedures Procedures    Medications Ordered in ED Medications - No data to display  ED Course/ Medical Decision Making/ A&P                                 Medical Decision Making Amount and/or Complexity of Data Reviewed Labs: ordered. Radiology: ordered.   This patient presents to the ED for concern of syncope and collapse , this involves an extensive number of treatment options, and is a complaint that carries with it a high risk of complications and morbidity.  The differential diagnosis includes intoxication, dysrhythmia, intracranial abnormality, others   Co morbidities that complicate the patient evaluation  Anxiety, hypertension   Additional history obtained:  Additional history obtained from family and EMS   Lab Tests:  I Ordered, and personally interpreted labs.  The pertinent results include: Potassium 3.4, creatinine 1.8,  ethanol 242   Imaging Studies ordered:  I ordered imaging studies including CT head and cervical spine I independently visualized and interpreted imaging which showed no acute findings I agree with the radiologist interpretation   Cardiac Monitoring: / EKG:  The patient was maintained on a cardiac monitor.  I personally viewed and interpreted the cardiac monitored which showed an underlying rhythm of: Sinus rhythm   Social Determinants of Health:  Patient is a daily tobacco smoker   Test / Admission - Considered:  Patient with syncope likely due to acute intoxication with elevated ethanol levels.  CT scans were unremarkable.  Patient does appear to be mildly dehydrated with mild AKI.  I have discussed with patient and recommended hydration at home with follow-up labs in approximately 1 week with primary care to ensure resolution of AKI.  Patient and family voiced understanding with plan and agreed to set a follow-up appointment.  Plan to discharge home at this time with return precautions.         Final Clinical Impression(s) / ED Diagnoses Final diagnoses:  Alcoholic intoxication without complication (HCC)  Fall, initial encounter  AKI (acute kidney injury) [facility])    Rx / DC Orders ED Discharge Orders     None         Pamala Duffel 03/28/23 0224    Darrick Grinder, PA-C 03/28/23 0250    Zadie Rhine, MD 03/28/23 [phone]

## 2023-03-28 NOTE — Discharge Instructions (Addendum)
Your workup tonight was reassuring.  Please follow-up with primary care in approximately 1 week to recheck your kidney labs.  You have a mild decrease in kidney function and need to hydrate at home.  If you develop any life-threatening symptoms please return to the emergency department.

## 2023-04-09 ENCOUNTER — Telehealth: Payer: Self-pay

## 2023-04-09 NOTE — Progress Notes (Signed)
 Transition Care Management Unsuccessful Follow-up Telephone Call  Date of discharge and from where:  03/28/2023 [facility]  Attempts:  1st Attempt  Reason for unsuccessful TCM follow-up call:  No answer/busy  Wanda Rideout [facility]  [facility], [facility] Resource Care Guide Direct Dial: [phone]  Website: [email or web address]

## 2023-04-10 ENCOUNTER — Telehealth: Payer: Self-pay

## 2023-04-10 NOTE — Progress Notes (Signed)
 Transition Care Management Unsuccessful Follow-up Telephone Call  Date of discharge and from where:  03/28/2023 [facility]  Attempts:  2nd Attempt  Reason for unsuccessful TCM follow-up call:  No answer/busy  Avante Carneiro [facility]  [facility], [facility] Resource Care Guide Direct Dial: [phone]  Website: [email or web address]

## 2023-04-28 DIAGNOSIS — J22 Unspecified acute lower respiratory infection: Secondary | ICD-10-CM | POA: Diagnosis not present

## 2023-04-28 DIAGNOSIS — R059 Cough, unspecified: Secondary | ICD-10-CM | POA: Diagnosis not present

## 2023-05-06 ENCOUNTER — Other Ambulatory Visit: Payer: Self-pay | Admitting: Family Medicine

## 2023-05-16 ENCOUNTER — Telehealth: Payer: Self-pay

## 2023-05-16 NOTE — Telephone Encounter (Unsigned)
Copied from CRM [id number]. Topic: Appointments - Appointment Scheduling >> May 16, 2023  9:49 AM Phill Myron wrote: Please call Mr. And Mrs. Black and explain to them [employer], 2nd insurances, AWV and Physicals.  I tried to explain but they had more questions. Please advise

## 2023-05-18 NOTE — Telephone Encounter (Signed)
Attempted to call pt, LVM re: msg about awv/physicals.

## 2023-05-23 ENCOUNTER — Ambulatory Visit: Payer: Medicare Other | Admitting: Family Medicine

## 2023-05-31 DIAGNOSIS — Z6822 Body mass index (BMI) 22.0-22.9, adult: Secondary | ICD-10-CM | POA: Diagnosis not present

## 2023-05-31 DIAGNOSIS — R059 Cough, unspecified: Secondary | ICD-10-CM | POA: Diagnosis not present

## 2023-05-31 DIAGNOSIS — B349 Viral infection, unspecified: Secondary | ICD-10-CM | POA: Diagnosis not present

## 2023-07-05 ENCOUNTER — Ambulatory Visit: Payer: Medicare Other | Admitting: *Deleted

## 2023-07-05 DIAGNOSIS — Z0000 Encounter for general adult medical examination without abnormal findings: Secondary | ICD-10-CM | POA: Diagnosis not present

## 2023-07-05 NOTE — Progress Notes (Signed)
 Subjective:   Robert Nixon is a 78 y.o. male who presents for Medicare Annual/Subsequent preventive examination.  Visit Complete: Virtual I connected with  Robert Nixon on 07/05/23 by a audio enabled telemedicine application and verified that I am speaking with the correct person using two identifiers.  Patient Location: Home  Provider Location: Home Office  I discussed the limitations of evaluation and management by telemedicine. The patient expressed understanding and agreed to proceed.  Vital Signs: Because this visit was a virtual/telehealth visit, some criteria may be missing or patient reported. Any vitals not documented were not able to be obtained and vitals that have been documented are patient reported.   Cardiac Risk Factors include: advanced age (>35men, >24 women);hypertension;male gender;smoking/ tobacco exposure     Objective:    There were no vitals filed for this visit. There is no height or weight on file to calculate BMI.     07/05/2023    3:24 PM 03/28/2023   12:01 AM 05/16/2022    3:10 PM 04/13/2021   10:59 AM 12/11/2020    8:34 AM 09/05/2019    3:07 PM 04/30/2018    8:33 AM  Advanced Directives  Does Patient Have a Medical Advance Directive? No No Yes No Yes Yes Yes  Type of Advance Directive   Living will;Healthcare Power of Attorney  Living will Healthcare Power of [place];Living will   Does patient want to make changes to medical advance directive?   No - Patient declined  No - Patient declined    Copy of Healthcare Power of Attorney in Chart?   No - copy requested   Yes - validated most recent copy scanned in chart (See row information)   Would patient like information on creating a medical advance directive? No - Patient declined          Current Medications (verified) Outpatient Encounter Medications as of 07/05/2023  Medication Sig   amLODipine (NORVASC) 10 MG tablet TAKE 1 TABLET BY MOUTH EVERY DAY AT BEDTIME   aspirin 81 MG tablet Take 81  mg by mouth daily.   atorvastatin (LIPITOR) 80 MG tablet Take 1 tablet (80 mg total) by mouth daily.   colchicine 0.6 MG tablet Take 2 tablets now and 1 tablet in 1 hour if symptoms persist. Repeat in 24 hours if symptoms not relieved.   diclofenac Sodium (VOLTAREN) 1 % GEL Apply 4 g topically 3 (three) times daily as needed (For shoulder pain).   fluticasone (FLONASE) 50 MCG/ACT nasal spray Place 2 sprays into both nostrils daily.   meclizine (ANTIVERT) 25 MG tablet Take 1 tablet (25 mg total) by mouth 3 (three) times daily as needed for dizziness.   Multiple Vitamins-Minerals (MULTIVITAMIN & MINERAL PO) Take 1 tablet by mouth daily.   sildenafil (VIAGRA) 50 MG tablet Take 1 tablet (50 mg total) by mouth daily as needed for erectile dysfunction.   valsartan-hydrochlorothiazide (DIOVAN-HCT) 160-12.5 MG tablet TAKE 1 TABLET BY MOUTH DAILY   predniSONE (DELTASONE) 20 MG tablet 3 tabs poqday 1-2, 2 tabs poqday 3-4, 1 tab poqday 5-6 (Patient not taking: Reported on 07/05/2023)   No facility-administered encounter medications on file as of 07/05/2023.    Allergies (verified) Patient has no known allergies.   History: Past Medical History:  Diagnosis Date   Allergy    Anxiety    Benign neoplasm of colon    Closed fracture of unspecified part of fibula    DIVERTICULOSIS OF COLON 05/20/2007   Qualifier: Diagnosis of  By: Kriste Basque MD, Lonzo Cloud    Hearing loss    no hearing aids   Hypertension    Lipoma of unspecified site    Pure hypercholesterolemia    diet controlled   Tobacco abuse    Past Surgical History:  Procedure Laterality Date   ANKLE SURGERY     right   COLONOSCOPY  03/10/2014   stark polyps   INGUINAL HERNIA REPAIR     NOSE SURGERY     POLYPECTOMY     Family History  Problem Relation Age of Onset   Cancer Father        neck CA   Hearing loss Father    Hypertension Mother    Hearing loss Sister    Hearing loss Brother    Cancer Brother    Colon cancer Maternal Uncle     Rectal cancer Neg Hx    Stomach cancer Neg Hx    Social History   Socioeconomic History   Marital status: Married    Spouse name: Robert Nixon   Number of children: Not on file   Years of education: Not on file   Highest education level: 12th grade  Occupational History   Not on file  Tobacco Use   Smoking status: Every Day    Current packs/day: 1.00    Average packs/day: 1 pack/day for 20.0 years (20.0 ttl pk-yrs)    Types: Cigarettes   Smokeless tobacco: Never  Vaping Use   Vaping status: Never Used  Substance and Sexual Activity   Alcohol use: Not Currently    Alcohol/week: 4.0 standard drinks of alcohol    Types: 4 Shots of liquor per week    Comment: Drinks brandy at night - 4 x week, quit drinking 05/2019   Drug use: No   Sexual activity: Yes  Other Topics Concern   Not on file  Social History Narrative   Still works part time    Married. 3 kids. 5 grandkids.       Retired Brewing technologist tobacco.       Hobbies: ride motorcycle   Social Drivers of Corporate investment banker Strain: Low Risk  (07/05/2023)   Overall Financial Resource Strain (CARDIA)    Difficulty of Paying Living Expenses: Not hard at all  Food Insecurity: No Food Insecurity (07/05/2023)   Hunger Vital Sign    Worried About Running Out of Food in the Last Year: Never true    Ran Out of Food in the Last Year: Never true  Transportation Needs: No Transportation Needs (07/05/2023)   PRAPARE - Administrator, Civil Service (Medical): No    Lack of Transportation (Non-Medical): No  Physical Activity: Sufficiently Active (07/05/2023)   Exercise Vital Sign    Days of Exercise per Week: 3 days    Minutes of Exercise per Session: 60 min  Stress: No Stress Concern Present (07/05/2023)   [employer] of Occupational Health - Occupational Stress Questionnaire    Feeling of Stress : Not at all  Social Connections: Socially Integrated (07/05/2023)   Social Connection and Isolation Panel [NHANES]     Frequency of Communication with Friends and Family: More than three times a week    Frequency of Social Gatherings with Friends and Family: More than three times a week    Attends Religious Services: More than 4 times per year    Active Member of [employer] or Organizations: Yes    Attends Banker Meetings: More than 4 times per  year    Marital Status: Married    Tobacco Counseling Ready to quit: Not Answered Counseling given: Not Answered   Clinical Intake:  Pre-visit preparation completed: Yes  Pain : No/denies pain     Diabetes: No  How often do you need to have someone help you when you read instructions, pamphlets, or other written materials from your doctor or pharmacy?: 1 - Never  Interpreter Needed?: No  Information entered by :: Remi Haggard LPN   Activities of Daily Living    07/05/2023    3:27 PM  In your present state of health, do you have any difficulty performing the following activities:  Hearing? 1  Vision? 0  Difficulty concentrating or making decisions? 0  Walking or climbing stairs? 0  Dressing or bathing? 0  Doing errands, shopping? 0  Preparing Food and eating ? N  Using the Toilet? N  In the past six months, have you accidently leaked urine? N  Do you have problems with loss of bowel control? N  Managing your Medications? N  Managing your Finances? N  Housekeeping or managing your Housekeeping? N    Patient Care Team: Park Meo, FNP as PCP - General (Family Medicine)  Indicate any recent Medical Services you may have received from other than [facility] providers in the past year (date may be approximate).     Assessment:   This is a routine wellness examination for Octavio.  Hearing/Vision screen Hearing Screening - Comments:: Some trouble hearing   No hearing aids Vision Screening - Comments:: Up to date [address]  Unsure of name   Goals Addressed             This Visit's Progress    Patient Stated        Continue current lifestyle       Depression Screen    07/05/2023    3:28 PM 02/15/2023    2:57 PM 10/02/2022    4:15 PM 05/16/2022    3:08 PM 03/20/2022    3:42 PM 01/02/2022   10:01 AM 01/02/2022    9:49 AM  PHQ 2/9 Scores  PHQ - 2 Score 0 0 0 0 0 1 0  PHQ- 9 Score 2 0 0  0 2 0    Fall Risk    10/02/2022    4:15 PM 05/16/2022    2:59 PM 03/20/2022    3:42 PM 03/20/2022    3:29 PM 01/02/2022    9:49 AM  Fall Risk   Falls in the past year? 0 0 0 0 0  Number falls in past yr: 0 0   0  Injury with Fall? 0 0   0  Follow up  Falls prevention discussed;Education provided;Falls evaluation completed       MEDICARE RISK AT HOME: Medicare Risk at Home Any stairs in or around the home?: No If so, are there any without handrails?: No Home free of loose throw rugs in walkways, pet beds, electrical cords, etc?: Yes Adequate lighting in your home to reduce risk of falls?: Yes Life alert?: No Use of a cane, walker or w/c?: No Grab bars in the bathroom?: No Shower chair or bench in shower?: No Elevated toilet seat or a handicapped toilet?: No  TIMED UP AND GO:  Was the test performed?  No    Cognitive Function:    12/11/2020    8:36 AM  MMSE - Mini Mental State Exam  Not completed: Unable to complete  07/05/2023    3:26 PM 05/16/2022    3:10 PM 12/11/2020    8:36 AM  6CIT Screen  What Year? 0 points 0 points 0 points  What month? 0 points 0 points 0 points  What time? 0 points 0 points 0 points  Count back from 20 2 points 0 points 0 points  Months in reverse 2 points 0 points 0 points  Repeat phrase 2 points 0 points 0 points  Total Score 6 points 0 points 0 points    Immunizations Immunization History  Administered Date(s) Administered   Fluad Quad(high Dose 65+) 02/04/2020, 01/05/2021, 01/02/2022   Fluad Trivalent(High Dose 65+) 02/15/2023   Influenza Split 01/02/2011   Influenza Whole 02/21/2010   Influenza, High Dose Seasonal PF 03/12/2014, 03/03/2019    Influenza,inj,Quad PF,6+ Mos 02/05/2015, 03/08/2016, 02/09/2017, 12/31/2017   Moderna Sars-Covid-2 Vaccination 05/11/2019, 06/11/2019   PNEUMOCOCCAL CONJUGATE-20 08/08/2021   Pneumococcal Conjugate-13 11/30/2014   Tdap 11/15/2009, 01/02/2022   Zoster Recombinant(Shingrix) 06/13/2021    TDAP status: Up to date  Flu Vaccine status: Up to date  Pneumococcal vaccine status: Up to date  Covid-19 vaccine status: Information provided on how to obtain vaccines.   Qualifies for Shingles Vaccine? Yes   Zostavax completed No   Shingrix Completed?: No.    Education has been provided regarding the importance of this vaccine. Patient has been advised to call insurance company to determine out of pocket expense if they have not yet received this vaccine. Advised may also receive vaccine at local pharmacy or Health Dept. Verbalized acceptance and understanding.  Screening Tests Health Maintenance  Topic Date Due   Lung Cancer Screening  Never done   Zoster Vaccines- Shingrix (2 of 2) 08/08/2021   COVID-19 Vaccine (3 - 2024-25 season) 12/03/2022   INFLUENZA VACCINE  11/02/2023   Medicare Annual Wellness (AWV)  07/04/2024   Colonoscopy  07/23/2024   DTaP/Tdap/Td (3 - Td or Tdap) 01/03/2032   Pneumonia Vaccine 15+ Years old  Completed   Hepatitis C Screening  Completed   HPV VACCINES  Aged Out    Health Maintenance  Health Maintenance Due  Topic Date Due   Lung Cancer Screening  Never done   Zoster Vaccines- Shingrix (2 of 2) 08/08/2021   COVID-19 Vaccine (3 - 2024-25 season) 12/03/2022    Colorectal cancer screening: No longer required.   Lung Cancer Screening: (Low Dose CT Chest recommended if Age 7-80 years, 20 pack-year currently smoking OR have quit w/in 15years.)  qualify.   Lung Cancer Screening Referral:   Additional Screening:  Hepatitis C Screening: does not qualify; Completed 2021  Vision Screening: Recommended annual ophthalmology exams for early detection of glaucoma  and other disorders of the eye. Is the patient up to date with their annual eye exam?  Yes  Who is the provider or what is the name of the office in which the patient attends annual eye exams? Unsure of name o If pt is not established with a provider, would they like to be referred to a provider to establish care? No .   Dental Screening: Recommended annual dental exams for proper oral hygiene   Community Resource Referral / Chronic Care Management: CRR required this visit?  No   CCM required this visit?  No     Plan:     I have personally reviewed and noted the following in the patient's chart:   Medical and social history Use of alcohol, tobacco or illicit drugs  Current medications and supplements  including opioid prescriptions. Patient is not currently taking opioid prescriptions. Functional ability and status Nutritional status Physical activity Advanced directives List of other physicians Hospitalizations, surgeries, and ER visits in previous 12 months Vitals Screenings to include cognitive, depression, and falls Referrals and appointments  In addition, I have reviewed and discussed with patient certain preventive protocols, quality metrics, and best practice recommendations. A written personalized care plan for preventive services as well as general preventive health recommendations were provided to patient.     Remi Haggard, LPN   04/03/9145   After Visit Summary: (MyChart) Due to this being a telephonic visit, the after visit summary with patients personalized plan was offered to patient via MyChart   Nurse Notes:

## 2023-07-05 NOTE — Patient Instructions (Signed)
 [name] , Thank you for taking time to come for your Medicare Wellness Visit. I appreciate your ongoing commitment to your health goals. Please review the following plan we discussed and let me know if I can assist you in the future.   Screening recommendations/referrals: Colonoscopy: up to date Recommended yearly ophthalmology/optometry visit for glaucoma screening and checkup Recommended yearly dental visit for hygiene and checkup  Vaccinations: Influenza vaccine: up to date Pneumococcal vaccine: up to date Tdap vaccine: up to date Shingles vaccine: Education provided       Preventive Care 65 Years and Older, Male Preventive care refers to lifestyle choices and visits with your health care provider that can promote health and wellness. What does preventive care include? A yearly physical exam. This is also called an annual well check. Dental exams once or twice a year. Routine eye exams. Ask your health care provider how often you should have your eyes checked. Personal lifestyle choices, including: Daily care of your teeth and gums. Regular physical activity. Eating a healthy diet. Avoiding tobacco and drug use. Limiting alcohol use. Practicing safe sex. Taking low doses of aspirin every day. Taking vitamin and mineral supplements as recommended by your health care provider. What happens during an annual well check? The services and screenings done by your health care provider during your annual well check will depend on your age, overall health, lifestyle risk factors, and family history of disease. Counseling  Your health care provider may ask you questions about your: Alcohol use. Tobacco use. Drug use. Emotional well-being. Home and relationship well-being. Sexual activity. Eating habits. History of falls. Memory and ability to understand (cognition). Work and work Astronomer. Screening  You may have the following tests or measurements: Height, weight,  and BMI. Blood pressure. Lipid and cholesterol levels. These may be checked every 5 years, or more frequently if you are over 52 years old. Skin check. Lung cancer screening. You may have this screening every year starting at age 54 if you have a 30-pack-year history of smoking and currently smoke or have quit within the past 15 years. Fecal occult blood test (FOBT) of the stool. You may have this test every year starting at age 74. Flexible sigmoidoscopy or colonoscopy. You may have a sigmoidoscopy every 5 years or a colonoscopy every 10 years starting at age 55. Prostate cancer screening. Recommendations will vary depending on your family history and other risks. Hepatitis C blood test. Hepatitis B blood test. Sexually transmitted disease (STD) testing. Diabetes screening. This is done by checking your blood sugar (glucose) after you have not eaten for a while (fasting). You may have this done every 1-3 years. Abdominal aortic aneurysm (AAA) screening. You may need this if you are a current or former smoker. Osteoporosis. You may be screened starting at age 29 if you are at high risk. Talk with your health care provider about your test results, treatment options, and if necessary, the need for more tests. Vaccines  Your health care provider may recommend certain vaccines, such as: Influenza vaccine. This is recommended every year. Tetanus, diphtheria, and acellular pertussis (Tdap, Td) vaccine. You may need a Td booster every 10 years. Zoster vaccine. You may need this after age 74. Pneumococcal 13-valent conjugate (PCV13) vaccine. One dose is recommended after age 76. Pneumococcal polysaccharide (PPSV23) vaccine. One dose is recommended after age 54. Talk to your health care provider about which screenings and vaccines you need and how often you need them. This information is not intended  to replace advice given to you by your health care provider. Make sure you discuss any questions you  have with your health care provider. Document Released: 04/16/2015 Document Revised: 12/08/2015 Document Reviewed: 01/19/2015 Elsevier Interactive Patient Education  2017 [employer].  Fall Prevention in the Home Falls can cause injuries. They can happen to people of all ages. There are many things you can do to make your home safe and to help prevent falls. What can I do on the outside of my home? Regularly fix the edges of walkways and driveways and fix any cracks. Remove anything that might make you trip as you walk through a door, such as a raised step or threshold. Trim any bushes or trees on the path to your home. Use bright outdoor lighting. Clear any walking paths of anything that might make someone trip, such as rocks or tools. Regularly check to see if handrails are loose or broken. Make sure that both sides of any steps have handrails. Any raised decks and porches should have guardrails on the edges. Have any leaves, snow, or ice cleared regularly. Use sand or salt on walking paths during winter. Clean up any spills in your garage right away. This includes oil or grease spills. What can I do in the bathroom? Use night lights. Install grab bars by the toilet and in the tub and shower. Do not use towel bars as grab bars. Use non-skid mats or decals in the tub or shower. If you need to sit down in the shower, use a plastic, non-slip stool. Keep the floor dry. Clean up any water that spills on the floor as soon as it happens. Remove soap buildup in the tub or shower regularly. Attach bath mats securely with double-sided non-slip rug tape. Do not have throw rugs and other things on the floor that can make you trip. What can I do in the bedroom? Use night lights. Make sure that you have a light by your bed that is easy to reach. Do not use any sheets or blankets that are too big for your bed. They should not hang down onto the floor. Have a firm chair that has side arms. You can  use this for support while you get dressed. Do not have throw rugs and other things on the floor that can make you trip. What can I do in the kitchen? Clean up any spills right away. Avoid walking on wet floors. Keep items that you use a lot in easy-to-reach places. If you need to reach something above you, use a strong step stool that has a grab bar. Keep electrical cords out of the way. Do not use floor polish or wax that makes floors slippery. If you must use wax, use non-skid floor wax. Do not have throw rugs and other things on the floor that can make you trip. What can I do with my stairs? Do not leave any items on the stairs. Make sure that there are handrails on both sides of the stairs and use them. Fix handrails that are broken or loose. Make sure that handrails are as long as the stairways. Check any carpeting to make sure that it is firmly attached to the stairs. Fix any carpet that is loose or worn. Avoid having throw rugs at the top or bottom of the stairs. If you do have throw rugs, attach them to the floor with carpet tape. Make sure that you have a light switch at the top of the stairs and  the bottom of the stairs. If you do not have them, ask someone to add them for you. What else can I do to help prevent falls? Wear shoes that: Do not have high heels. Have rubber bottoms. Are comfortable and fit you well. Are closed at the toe. Do not wear sandals. If you use a stepladder: Make sure that it is fully opened. Do not climb a closed stepladder. Make sure that both sides of the stepladder are locked into place. Ask someone to hold it for you, if possible. Clearly mark and make sure that you can see: Any grab bars or handrails. First and last steps. Where the edge of each step is. Use tools that help you move around (mobility aids) if they are needed. These include: Canes. Walkers. Scooters. Crutches. Turn on the lights when you go into a dark area. Replace any light  bulbs as soon as they burn out. Set up your furniture so you have a clear path. Avoid moving your furniture around. If any of your floors are uneven, fix them. If there are any pets around you, be aware of where they are. Review your medicines with your doctor. Some medicines can make you feel dizzy. This can increase your chance of falling. Ask your doctor what other things that you can do to help prevent falls. This information is not intended to replace advice given to you by your health care provider. Make sure you discuss any questions you have with your health care provider. Document Released: 01/14/2009 Document Revised: 08/26/2015 Document Reviewed: 04/24/2014 Elsevier Interactive Patient Education  2017 [employer].

## 2023-09-05 DIAGNOSIS — H35033 Hypertensive retinopathy, bilateral: Secondary | ICD-10-CM | POA: Diagnosis not present

## 2023-09-05 DIAGNOSIS — I10 Essential (primary) hypertension: Secondary | ICD-10-CM | POA: Diagnosis not present

## 2023-09-05 DIAGNOSIS — H524 Presbyopia: Secondary | ICD-10-CM | POA: Diagnosis not present

## 2023-09-05 DIAGNOSIS — H5203 Hypermetropia, bilateral: Secondary | ICD-10-CM | POA: Diagnosis not present

## 2023-09-05 DIAGNOSIS — H2513 Age-related nuclear cataract, bilateral: Secondary | ICD-10-CM | POA: Diagnosis not present

## 2023-10-30 ENCOUNTER — Other Ambulatory Visit: Payer: Self-pay | Admitting: Family Medicine

## 2023-10-30 DIAGNOSIS — I10 Essential (primary) hypertension: Secondary | ICD-10-CM

## 2024-01-31 ENCOUNTER — Other Ambulatory Visit: Payer: Self-pay | Admitting: Family Medicine

## 2024-01-31 ENCOUNTER — Ambulatory Visit (INDEPENDENT_AMBULATORY_CARE_PROVIDER_SITE_OTHER)

## 2024-01-31 DIAGNOSIS — Z23 Encounter for immunization: Secondary | ICD-10-CM | POA: Diagnosis not present

## 2024-01-31 DIAGNOSIS — I10 Essential (primary) hypertension: Secondary | ICD-10-CM

## 2024-01-31 NOTE — Progress Notes (Signed)
 Patient is in office today for a nurse visit for Immunization. Patient Injection was given in the  Left deltoid. Patient tolerated injection well.

## 2024-02-04 DIAGNOSIS — I10 Essential (primary) hypertension: Secondary | ICD-10-CM | POA: Diagnosis not present

## 2024-04-30 ENCOUNTER — Other Ambulatory Visit: Payer: Self-pay | Admitting: Family Medicine

## 2024-04-30 DIAGNOSIS — I10 Essential (primary) hypertension: Secondary | ICD-10-CM

## 2024-05-06 ENCOUNTER — Other Ambulatory Visit: Payer: Self-pay | Admitting: Family Medicine

## 2024-07-10 ENCOUNTER — Encounter
